# Patient Record
Sex: Female | Born: 1985 | Marital: Single | Smoking: Never smoker
Health system: Southern US, Community
[De-identification: ages and names within clinical notes are randomized; demographics above are authoritative.]

## PROBLEM LIST (undated history)

## (undated) DIAGNOSIS — F329 Major depressive disorder, single episode, unspecified: Secondary | ICD-10-CM

## (undated) DIAGNOSIS — I639 Cerebral infarction, unspecified: Secondary | ICD-10-CM

## (undated) DIAGNOSIS — Z8719 Personal history of other diseases of the digestive system: Secondary | ICD-10-CM

## (undated) DIAGNOSIS — F419 Anxiety disorder, unspecified: Secondary | ICD-10-CM

## (undated) DIAGNOSIS — F32A Depression, unspecified: Secondary | ICD-10-CM

## (undated) DIAGNOSIS — Z87442 Personal history of urinary calculi: Secondary | ICD-10-CM

## (undated) HISTORY — DX: Cerebral infarction, unspecified: I63.9

## (undated) HISTORY — PX: WISDOM TOOTH EXTRACTION: SHX21

---

## 2012-08-28 DIAGNOSIS — G4733 Obstructive sleep apnea (adult) (pediatric): Secondary | ICD-10-CM | POA: Insufficient documentation

## 2012-08-28 DIAGNOSIS — L680 Hirsutism: Secondary | ICD-10-CM | POA: Insufficient documentation

## 2012-08-28 DIAGNOSIS — E785 Hyperlipidemia, unspecified: Secondary | ICD-10-CM | POA: Insufficient documentation

## 2013-05-30 DIAGNOSIS — F32A Depression, unspecified: Secondary | ICD-10-CM | POA: Insufficient documentation

## 2013-05-30 DIAGNOSIS — F329 Major depressive disorder, single episode, unspecified: Secondary | ICD-10-CM | POA: Insufficient documentation

## 2013-05-30 DIAGNOSIS — F330 Major depressive disorder, recurrent, mild: Secondary | ICD-10-CM | POA: Insufficient documentation

## 2013-08-08 DIAGNOSIS — I10 Essential (primary) hypertension: Secondary | ICD-10-CM | POA: Insufficient documentation

## 2014-01-22 ENCOUNTER — Other Ambulatory Visit (INDEPENDENT_AMBULATORY_CARE_PROVIDER_SITE_OTHER): Payer: Self-pay

## 2014-01-22 LAB — COMPREHENSIVE METABOLIC PANEL
ALT: 21 U/L (ref 0–35)
AST: 23 U/L (ref 0–37)
Albumin: 3.7 g/dL (ref 3.5–5.2)
Alkaline Phosphatase: 78 U/L (ref 39–117)
BUN: 13 mg/dL (ref 6–23)
CALCIUM: 8.8 mg/dL (ref 8.4–10.5)
CO2: 24 meq/L (ref 19–32)
CREATININE: 0.51 mg/dL (ref 0.50–1.10)
Chloride: 106 mEq/L (ref 96–112)
Glucose, Bld: 83 mg/dL (ref 70–99)
Potassium: 4.2 mEq/L (ref 3.5–5.3)
Sodium: 139 mEq/L (ref 135–145)
Total Bilirubin: 0.6 mg/dL (ref 0.2–1.2)
Total Protein: 6.2 g/dL (ref 6.0–8.3)

## 2014-01-22 LAB — CBC WITH DIFFERENTIAL/PLATELET
Basophils Absolute: 0 10*3/uL (ref 0.0–0.1)
Basophils Relative: 0 % (ref 0–1)
EOS ABS: 0.1 10*3/uL (ref 0.0–0.7)
Eosinophils Relative: 2 % (ref 0–5)
HCT: 40.9 % (ref 36.0–46.0)
Hemoglobin: 13.7 g/dL (ref 12.0–15.0)
LYMPHS ABS: 1.8 10*3/uL (ref 0.7–4.0)
LYMPHS PCT: 25 % (ref 12–46)
MCH: 32 pg (ref 26.0–34.0)
MCHC: 33.5 g/dL (ref 30.0–36.0)
MCV: 95.6 fL (ref 78.0–100.0)
MONO ABS: 0.7 10*3/uL (ref 0.1–1.0)
MPV: 10.5 fL (ref 9.4–12.4)
Monocytes Relative: 10 % (ref 3–12)
Neutro Abs: 4.5 10*3/uL (ref 1.7–7.7)
Neutrophils Relative %: 63 % (ref 43–77)
Platelets: 337 10*3/uL (ref 150–400)
RBC: 4.28 MIL/uL (ref 3.87–5.11)
RDW: 12.7 % (ref 11.5–15.5)
WBC: 7.1 10*3/uL (ref 4.0–10.5)

## 2014-01-22 LAB — T4: T4 TOTAL: 8.7 ug/dL (ref 4.5–12.0)

## 2014-01-22 LAB — LIPID PANEL
Cholesterol: 172 mg/dL (ref 0–200)
HDL: 55 mg/dL (ref >39)
LDL CALC: 108 mg/dL — AB (ref 0–99)
TRIGLYCERIDES: 44 mg/dL (ref <150)
Total CHOL/HDL Ratio: 3.1 Ratio
VLDL: 9 mg/dL (ref 0–40)

## 2014-01-22 LAB — HEMOGLOBIN A1C
Hgb A1c MFr Bld: 5.1 % (ref <5.7)
MEAN PLASMA GLUCOSE: 100 mg/dL (ref <117)

## 2014-01-22 LAB — TSH: TSH: 4.055 u[IU]/mL (ref 0.350–4.500)

## 2014-01-23 LAB — HCG, SERUM, QUALITATIVE: PREG SERUM: NEGATIVE

## 2014-01-23 LAB — H. PYLORI ANTIBODY, IGG: H Pylori IgG: 0.57 {ISR}

## 2014-01-27 LAB — VITAMIN D 1,25 DIHYDROXY
Vitamin D 1, 25 (OH)2 Total: 46 pg/mL (ref 18–72)
Vitamin D2 1, 25 (OH)2: <8 pg/mL
Vitamin D3 1, 25 (OH)2: 46 pg/mL

## 2014-02-06 ENCOUNTER — Ambulatory Visit (HOSPITAL_COMMUNITY)
Admission: RE | Admit: 2014-02-06 | Discharge: 2014-02-06 | Disposition: A | Discharge status: Home / Self Care | Payer: Commercial Managed Care - PPO | Source: Ambulatory Visit | Attending: General Surgery | Admitting: General Surgery

## 2014-02-06 DIAGNOSIS — Z6841 Body Mass Index (BMI) 40.0 and over, adult: Secondary | ICD-10-CM | POA: Insufficient documentation

## 2014-02-06 DIAGNOSIS — K449 Diaphragmatic hernia without obstruction or gangrene: Secondary | ICD-10-CM | POA: Insufficient documentation

## 2014-02-08 ENCOUNTER — Encounter: Payer: Commercial Managed Care - PPO | Attending: General Surgery | Admitting: Dietician

## 2014-02-08 ENCOUNTER — Encounter: Payer: Self-pay | Admitting: Dietician

## 2014-02-08 DIAGNOSIS — Z6841 Body Mass Index (BMI) 40.0 and over, adult: Secondary | ICD-10-CM | POA: Diagnosis not present

## 2014-02-08 DIAGNOSIS — Z713 Dietary counseling and surveillance: Secondary | ICD-10-CM | POA: Insufficient documentation

## 2014-02-08 NOTE — Progress Notes (Signed)
  Pre-Op Assessment Visit:  Pre-Operative Sleeve Gastrectomy Surgery  Medical Nutrition Therapy:  Appt start time: 1420   End time:  1445.  Patient was seen on 02/08/14 for Pre-Operative Nutrition Assessment. Assessment and letter of approval [facility] coordinator on 02/08/14.   Preferred Learning Style:   No preference indicated   Learning Readiness:   Ready  Handouts given during visit include:  Pre-Op Goals Bariatric Surgery Protein Shakes   During the appointment today the following Pre-Op Goals were reviewed with the patient: Maintain or lose weight as instructed by your surgeon Make healthy food choices Begin to limit portion sizes Limited concentrated sugars and fried foods Keep fat/sugar in the single digits per serving on   food labels Practice CHEWING your food  (aim for 30 chews per bite or until applesauce consistency) Practice not drinking 15 minutes before, during, and 30 minutes after each meal/snack Avoid all carbonated beverages  Avoid/limit caffeinated beverages  Avoid all sugar-sweetened beverages Consume 3 meals per day; eat every 3-5 hours Make a list of non-food related activities Aim for 64-100 ounces of FLUID daily  Aim for at least 60-80 grams of PROTEIN daily Look for a liquid protein source that contain ?15 g protein and ?5 g carbohydrate  (ex: shakes, drinks, shots)  Patient-Centered Goals: Frederik SchmidtRosalie is hoping to be more active and have more energy after surgery.   Kashish would also like to live a longer life after having the surgery.  Frederik SchmidtRosalie feels very confident (10) in being able to follow the Pre-Op goals and feels they are very important (10).   Demonstrated degree of understanding via:  Teach Back  Teaching Method Utilized:  Visual Auditory Hands on  Barriers to learning/adherence to lifestyle change: none  Patient to call the [facility] to enroll in  Pre-Op and Post-Op Nutrition Education when surgery date is scheduled.

## 2014-02-08 NOTE — Patient Instructions (Signed)

## 2014-06-09 ENCOUNTER — Encounter: Payer: 59 | Attending: General Surgery

## 2014-06-09 DIAGNOSIS — Z713 Dietary counseling and surveillance: Secondary | ICD-10-CM | POA: Insufficient documentation

## 2014-06-09 DIAGNOSIS — Z6841 Body Mass Index (BMI) 40.0 and over, adult: Secondary | ICD-10-CM | POA: Insufficient documentation

## 2014-06-09 NOTE — Progress Notes (Signed)
  Pre-Operative Nutrition Class:  Appt start time: 830   End time:  930.  Patient was seen on 06/09/14 for Pre-Operative Bariatric Surgery Education at the [facility].   Surgery date:  Surgery type: Gastric sleeve Start weight at [facility]: 359.5 lbs Weight today: 366.5  TANITA  BODY COMP RESULTS  06/09/14   BMI (kg/m^2) 64.9   Fat Mass (lbs) 225   Fat Free Mass (lbs) 141.5   Total Body Water (lbs) 103.5   Samples given per MNT protocol. Patient educated on appropriate usage: Premier protein shake (qty 1 - vanilla) Lot #: [id number] Exp: 02/2015  Unjury protein powder (unflavored - qty 1) Lot #: [id number] Exp: 04/2015  Bariatric Advantage Calcium citrate chew (orange - qty 1) Lot #: [id number]  Exp: 07/2014  The following the learning objectives were met by the patient during this course:  Identify Pre-Op Dietary Goals and will begin 2 weeks pre-operatively  Identify appropriate sources of fluids and proteins   State protein recommendations and appropriate sources pre and post-operatively  Identify Post-Operative Dietary Goals and will follow for 2 weeks post-operatively  Identify appropriate multivitamin and calcium sources  Describe the need for physical activity post-operatively and will follow MD recommendations  State when to call healthcare provider regarding medication questions or post-operative complications  Handouts given during class include:  Pre-Op Bariatric Surgery Diet Handout  Protein Shake Handout  Post-Op Bariatric Surgery Nutrition Handout  BELT Program Information Flyer  Support Group Information Flyer  [facility] Bariatric Supplements Price List  Follow-Up Plan: Patient will follow-up at [facility] 2 weeks post operatively for diet advancement per MD.

## 2014-07-09 ENCOUNTER — Ambulatory Visit: Payer: Self-pay | Admitting: General Surgery

## 2014-08-19 NOTE — Patient Instructions (Addendum)
20 Dorothy Marquez  08/19/2014   Your procedure is scheduled on:   -08-25-2014 Monday  Enter through [facility]  Entrance and follow signs to [facility]. Arrive at      0900 AM.  (Limit 1 person with you).  Call this number if you have problems the morning of surgery: [phone]  Or Presurgical Testing [phone].   For Living Will and/or Health Care Power Attorney Forms: please provide copy for your medical record,may bring AM of surgery(Forms should be already notarized -we do not provide this service).(08-21-14 Yes/ No information preferred today).  Remember: Follow any bowel prep instructions per MD office. For Cpap use: Bring mask and tubing only.   Do not eat food/ or drink: After Midnight.      Take these medicines the morning of surgery with A SIP OF WATER: Fluoxetine.   Do not wear jewelry, make-up or nail polish.  Do not wear deodorant, lotions, powders, or perfumes.   Do not shave legs and under arms- 48 hours(2 days) prior to first CHG shower.(Shaving face and neck okay.)  Do not bring valuables to the hospital.(Hospital is not responsible for lost valuables).  Contacts, dentures or removable bridgework, body piercing, hair pins may not be worn into surgery.  Leave suitcase in the car. After surgery it may be brought to your room.  For patients admitted to the hospital, checkout time is 11:00 AM the day of discharge.(Restricted visitors-Any Persons displaying flu-like symptoms or illness).    Patients discharged the day of surgery will not be allowed to drive home. Must have responsible person with you x 24 hours once discharged.  Name and phone number of your driver: Liane Comber, boyfriend- [phone] cell     Please read over the following fact sheets that you were given:  CHG(Chlorhexidine Gluconate 4% Surgical Soap) use.           [facility] - Preparing for Surgery Before surgery, you can play an important role.  Because skin is not sterile,  your skin needs to be as free of germs as possible.  You can reduce the number of germs on your skin by washing with CHG (chlorahexidine gluconate) soap before surgery.  CHG is an antiseptic cleaner which kills germs and bonds with the skin to continue killing germs even after washing. Please DO NOT use if you have an allergy to CHG or antibacterial soaps.  If your skin becomes reddened/irritated stop using the CHG and inform your nurse when you arrive at Short Stay. Do not shave (including legs and underarms) for at least 48 hours prior to the first CHG shower.  You may shave your face/neck. Please follow these instructions carefully:  1.  Shower with CHG Soap the night before surgery and the  morning of Surgery.  2.  If you choose to wash your hair, wash your hair first as usual with your  normal  shampoo.  3.  After you shampoo, rinse your hair and body thoroughly to remove the  shampoo.                           4.  Use CHG as you would any other liquid soap.  You can apply chg directly  to the skin and wash                       Gently with a scrungie or clean washcloth.  5.  Apply the CHG Soap to your body ONLY FROM THE NECK DOWN.   Do not use on face/ open                           Wound or open sores. Avoid contact with eyes, ears mouth and genitals (private parts).                       Wash face,  Genitals (private parts) with your normal soap.             6.  Wash thoroughly, paying special attention to the area where your surgery  will be performed.  7.  Thoroughly rinse your body with warm water from the neck down.  8.  DO NOT shower/wash with your normal soap after using and rinsing off  the CHG Soap.                9.  Pat yourself dry with a clean towel.            10.  Wear clean pajamas.            11.  Place clean sheets on your bed the night of your first shower and do not  sleep with pets. Day of Surgery : Do not apply any lotions/deodorants the morning of surgery.  Please wear  clean clothes to the hospital/surgery center.  FAILURE TO FOLLOW THESE INSTRUCTIONS MAY RESULT IN THE CANCELLATION OF YOUR SURGERY PATIENT SIGNATURE_________________________________  NURSE SIGNATURE__________________________________  ________________________________________________________________________

## 2014-08-21 ENCOUNTER — Encounter (HOSPITAL_COMMUNITY)
Admission: RE | Admit: 2014-08-21 | Discharge: 2014-08-21 | Disposition: A | Discharge status: Home / Self Care | Payer: 59 | Source: Ambulatory Visit | Attending: General Surgery | Admitting: General Surgery

## 2014-08-21 ENCOUNTER — Encounter (HOSPITAL_COMMUNITY): Payer: Self-pay

## 2014-08-21 DIAGNOSIS — Z01818 Encounter for other preprocedural examination: Secondary | ICD-10-CM | POA: Insufficient documentation

## 2014-08-21 HISTORY — DX: Personal history of urinary calculi: Z87.442

## 2014-08-21 HISTORY — DX: Depression, unspecified: F32.A

## 2014-08-21 HISTORY — DX: Major depressive disorder, single episode, unspecified: F32.9

## 2014-08-21 HISTORY — DX: Anxiety disorder, unspecified: F41.9

## 2014-08-21 HISTORY — DX: Personal history of other diseases of the digestive system: Z87.19

## 2014-08-21 LAB — CBC WITH DIFFERENTIAL/PLATELET
Basophils Absolute: 0 10*3/uL (ref 0.0–0.1)
Basophils Relative: 0 % (ref 0–1)
EOS ABS: 0.1 10*3/uL (ref 0.0–0.7)
EOS PCT: 2 % (ref 0–5)
HEMATOCRIT: 42 % (ref 36.0–46.0)
HEMOGLOBIN: 13.9 g/dL (ref 12.0–15.0)
LYMPHS ABS: 1.9 10*3/uL (ref 0.7–4.0)
Lymphocytes Relative: 29 % (ref 12–46)
MCH: 31.7 pg (ref 26.0–34.0)
MCHC: 33.1 g/dL (ref 30.0–36.0)
MCV: 95.7 fL (ref 78.0–100.0)
Monocytes Absolute: 0.8 10*3/uL (ref 0.1–1.0)
Monocytes Relative: 12 % (ref 3–12)
Neutro Abs: 3.9 10*3/uL (ref 1.7–7.7)
Neutrophils Relative %: 57 % (ref 43–77)
Platelets: 303 10*3/uL (ref 150–400)
RBC: 4.39 MIL/uL (ref 3.87–5.11)
RDW: 12.7 % (ref 11.5–15.5)
WBC: 6.8 10*3/uL (ref 4.0–10.5)

## 2014-08-21 LAB — COMPREHENSIVE METABOLIC PANEL
ALT: 20 U/L (ref 14–54)
AST: 20 U/L (ref 15–41)
Albumin: 4 g/dL (ref 3.5–5.0)
Alkaline Phosphatase: 81 U/L (ref 38–126)
Anion gap: 5 (ref 5–15)
BUN: 10 mg/dL (ref 6–20)
CALCIUM: 9.4 mg/dL (ref 8.9–10.3)
CO2: 29 mmol/L (ref 22–32)
Chloride: 106 mmol/L (ref 101–111)
Creatinine, Ser: 0.68 mg/dL (ref 0.44–1.00)
GFR calc Af Amer: >60 mL/min (ref >60)
GLUCOSE: 92 mg/dL (ref 65–99)
Potassium: 4.6 mmol/L (ref 3.5–5.1)
SODIUM: 140 mmol/L (ref 135–145)
Total Bilirubin: 0.6 mg/dL (ref 0.3–1.2)
Total Protein: 7.2 g/dL (ref 6.5–8.1)

## 2014-08-21 NOTE — Pre-Procedure Instructions (Addendum)
08-21-14 EKG 12'15 Epic. 08-21-14 1100 Bari-bed  requested with portable equipment"Gail".

## 2014-08-25 ENCOUNTER — Encounter (HOSPITAL_COMMUNITY)
Admission: RE | Disposition: A | Discharge status: Home / Self Care | Payer: Self-pay | Source: Ambulatory Visit | Attending: General Surgery

## 2014-08-25 ENCOUNTER — Inpatient Hospital Stay (HOSPITAL_COMMUNITY)
Admission: RE | Admit: 2014-08-25 | Discharge: 2014-08-28 | DRG: 621 | Disposition: A | Discharge status: Home / Self Care | Payer: 59 | Source: Ambulatory Visit | Attending: General Surgery | Admitting: General Surgery

## 2014-08-25 ENCOUNTER — Inpatient Hospital Stay (HOSPITAL_COMMUNITY): Payer: 59 | Admitting: Certified Registered Nurse Anesthetist

## 2014-08-25 ENCOUNTER — Encounter (HOSPITAL_COMMUNITY): Payer: Self-pay

## 2014-08-25 DIAGNOSIS — R110 Nausea: Secondary | ICD-10-CM

## 2014-08-25 DIAGNOSIS — F419 Anxiety disorder, unspecified: Secondary | ICD-10-CM | POA: Diagnosis present

## 2014-08-25 DIAGNOSIS — Z6841 Body Mass Index (BMI) 40.0 and over, adult: Secondary | ICD-10-CM | POA: Diagnosis not present

## 2014-08-25 DIAGNOSIS — F329 Major depressive disorder, single episode, unspecified: Secondary | ICD-10-CM | POA: Diagnosis present

## 2014-08-25 DIAGNOSIS — K219 Gastro-esophageal reflux disease without esophagitis: Secondary | ICD-10-CM | POA: Diagnosis present

## 2014-08-25 DIAGNOSIS — Z833 Family history of diabetes mellitus: Secondary | ICD-10-CM

## 2014-08-25 DIAGNOSIS — Z01812 Encounter for preprocedural laboratory examination: Secondary | ICD-10-CM

## 2014-08-25 DIAGNOSIS — E785 Hyperlipidemia, unspecified: Secondary | ICD-10-CM | POA: Diagnosis present

## 2014-08-25 DIAGNOSIS — IMO0001 Reserved for inherently not codable concepts without codable children: Secondary | ICD-10-CM | POA: Diagnosis present

## 2014-08-25 DIAGNOSIS — K449 Diaphragmatic hernia without obstruction or gangrene: Secondary | ICD-10-CM | POA: Diagnosis present

## 2014-08-25 DIAGNOSIS — R030 Elevated blood-pressure reading, without diagnosis of hypertension: Secondary | ICD-10-CM | POA: Diagnosis present

## 2014-08-25 DIAGNOSIS — Z8249 Family history of ischemic heart disease and other diseases of the circulatory system: Secondary | ICD-10-CM | POA: Diagnosis not present

## 2014-08-25 DIAGNOSIS — Z9884 Bariatric surgery status: Secondary | ICD-10-CM

## 2014-08-25 DIAGNOSIS — G4733 Obstructive sleep apnea (adult) (pediatric): Secondary | ICD-10-CM | POA: Diagnosis present

## 2014-08-25 HISTORY — PX: LAPAROSCOPIC GASTRIC SLEEVE RESECTION WITH HIATAL HERNIA REPAIR: SHX6512

## 2014-08-25 HISTORY — PX: UPPER GI ENDOSCOPY: SHX6162

## 2014-08-25 LAB — PREGNANCY, URINE: PREG TEST UR: NEGATIVE

## 2014-08-25 LAB — HEMOGLOBIN AND HEMATOCRIT, BLOOD
HEMATOCRIT: 43.2 % (ref 36.0–46.0)
Hemoglobin: 14.2 g/dL (ref 12.0–15.0)

## 2014-08-25 SURGERY — GASTRECTOMY, SLEEVE, LAPAROSCOPIC, WITH HIATAL HERNIA REPAIR
Anesthesia: General | Site: Esophagus

## 2014-08-25 MED ORDER — CHLORHEXIDINE GLUCONATE 4 % EX LIQD: 60.0000 mL | Freq: Once | CUTANEOUS | Status: DC

## 2014-08-25 MED ORDER — LACTATED RINGERS IR SOLN
Status: DC | PRN
  Administered 2014-08-25: 1000 mL

## 2014-08-25 MED ORDER — CEFOTETAN DISODIUM-DEXTROSE 2-2.08 GM-% IV SOLR
2.0000 g | INTRAVENOUS | Status: AC
  Administered 2014-08-25: 2 g via INTRAVENOUS

## 2014-08-25 MED ORDER — PROMETHAZINE HCL 25 MG/ML IJ SOLN
INTRAMUSCULAR | Status: AC
  Filled 2014-08-25: qty 1

## 2014-08-25 MED ORDER — UNJURY VANILLA POWDER
2.0000 [oz_av] | Freq: Four times a day (QID) | ORAL | Status: DC
  Administered 2014-08-27 (×2): 2 [oz_av] via ORAL

## 2014-08-25 MED ORDER — METOCLOPRAMIDE HCL 5 MG/ML IJ SOLN
INTRAMUSCULAR | Status: AC
  Filled 2014-08-25: qty 2

## 2014-08-25 MED ORDER — NEOSTIGMINE METHYLSULFATE 10 MG/10ML IV SOLN
INTRAVENOUS | Status: DC | PRN
  Administered 2014-08-25: 3 mg via INTRAVENOUS

## 2014-08-25 MED ORDER — PROPOFOL 10 MG/ML IV BOLUS
INTRAVENOUS | Status: DC | PRN
  Administered 2014-08-25: 200 mg via INTRAVENOUS

## 2014-08-25 MED ORDER — FENTANYL CITRATE (PF) 100 MCG/2ML IJ SOLN
INTRAMUSCULAR | Status: DC | PRN
  Administered 2014-08-25: 25 ug via INTRAVENOUS
  Administered 2014-08-25 (×3): 50 ug via INTRAVENOUS
  Administered 2014-08-25: 25 ug via INTRAVENOUS
  Administered 2014-08-25 (×3): 50 ug via INTRAVENOUS

## 2014-08-25 MED ORDER — CISATRACURIUM BESYLATE 20 MG/10ML IV SOLN
INTRAVENOUS | Status: AC
  Filled 2014-08-25: qty 10

## 2014-08-25 MED ORDER — FENTANYL CITRATE (PF) 100 MCG/2ML IJ SOLN
INTRAMUSCULAR | Status: AC
  Filled 2014-08-25: qty 2

## 2014-08-25 MED ORDER — ENOXAPARIN SODIUM 30 MG/0.3ML ~~LOC~~ SOLN
30.0000 mg | Freq: Two times a day (BID) | SUBCUTANEOUS | Status: DC
  Administered 2014-08-26 – 2014-08-28 (×5): 30 mg via SUBCUTANEOUS
  Filled 2014-08-25 (×7): qty 0.3

## 2014-08-25 MED ORDER — LIDOCAINE HCL (CARDIAC) 20 MG/ML IV SOLN
INTRAVENOUS | Status: AC
  Filled 2014-08-25: qty 5

## 2014-08-25 MED ORDER — CISATRACURIUM BESYLATE (PF) 10 MG/5ML IV SOLN
INTRAVENOUS | Status: DC | PRN
  Administered 2014-08-25: 4 mg via INTRAVENOUS
  Administered 2014-08-25 (×2): 2 mg via INTRAVENOUS
  Administered 2014-08-25: 10 mg via INTRAVENOUS

## 2014-08-25 MED ORDER — DIPHENHYDRAMINE HCL 50 MG/ML IJ SOLN
INTRAMUSCULAR | Status: DC | PRN
  Administered 2014-08-25 (×2): 12.5 mg via INTRAVENOUS

## 2014-08-25 MED ORDER — HEPARIN SODIUM (PORCINE) 5000 UNIT/ML IJ SOLN
5000.0000 [IU] | INTRAMUSCULAR | Status: AC
  Administered 2014-08-25: 5000 [IU] via SUBCUTANEOUS
  Filled 2014-08-25: qty 1

## 2014-08-25 MED ORDER — PANTOPRAZOLE SODIUM 40 MG IV SOLR
40.0000 mg | Freq: Every day | INTRAVENOUS | Status: DC
  Administered 2014-08-25 – 2014-08-27 (×3): 40 mg via INTRAVENOUS
  Filled 2014-08-25 (×4): qty 40

## 2014-08-25 MED ORDER — FENTANYL CITRATE (PF) 250 MCG/5ML IJ SOLN
INTRAMUSCULAR | Status: AC
  Filled 2014-08-25: qty 5

## 2014-08-25 MED ORDER — KCL IN DEXTROSE-NACL 20-5-0.45 MEQ/L-%-% IV SOLN
INTRAVENOUS | Status: DC
  Administered 2014-08-25: 125 mL/h via INTRAVENOUS
  Administered 2014-08-26 – 2014-08-27 (×3): via INTRAVENOUS
  Administered 2014-08-27: 1000 mL via INTRAVENOUS
  Administered 2014-08-27: via INTRAVENOUS
  Administered 2014-08-28: 1000 mL via INTRAVENOUS
  Filled 2014-08-25 (×12): qty 1000

## 2014-08-25 MED ORDER — METHOCARBAMOL 1000 MG/10ML IJ SOLN
1000.0000 mg | Freq: Three times a day (TID) | INTRAVENOUS | Status: DC
  Administered 2014-08-25 – 2014-08-28 (×9): 1000 mg via INTRAVENOUS
  Filled 2014-08-25 (×10): qty 10

## 2014-08-25 MED ORDER — MEPERIDINE HCL 50 MG/ML IJ SOLN: 6.2500 mg | INTRAMUSCULAR | Status: DC | PRN

## 2014-08-25 MED ORDER — UNJURY CHICKEN SOUP POWDER
2.0000 [oz_av] | Freq: Four times a day (QID) | ORAL | Status: DC
  Administered 2014-08-28 (×2): 2 [oz_av] via ORAL

## 2014-08-25 MED ORDER — SODIUM CHLORIDE 0.9 % IJ SOLN
INTRAMUSCULAR | Status: AC
  Filled 2014-08-25: qty 50

## 2014-08-25 MED ORDER — MORPHINE SULFATE 2 MG/ML IJ SOLN
2.0000 mg | INTRAMUSCULAR | Status: DC | PRN
  Administered 2014-08-25: 2 mg via INTRAVENOUS
  Administered 2014-08-26: 4 mg via INTRAVENOUS
  Administered 2014-08-26 (×2): 2 mg via INTRAVENOUS
  Administered 2014-08-26: 4 mg via INTRAVENOUS
  Filled 2014-08-25: qty 1
  Filled 2014-08-25 (×2): qty 2
  Filled 2014-08-25 (×2): qty 1

## 2014-08-25 MED ORDER — BUPIVACAINE LIPOSOME 1.3 % IJ SUSP
20.0000 mL | Freq: Once | INTRAMUSCULAR | Status: AC
  Administered 2014-08-25: 20 mL
  Filled 2014-08-25: qty 20

## 2014-08-25 MED ORDER — GLYCOPYRROLATE 0.2 MG/ML IJ SOLN
INTRAMUSCULAR | Status: AC
  Filled 2014-08-25: qty 3

## 2014-08-25 MED ORDER — METOCLOPRAMIDE HCL 5 MG/ML IJ SOLN
INTRAMUSCULAR | Status: DC | PRN
  Administered 2014-08-25 (×2): 5 mg via INTRAVENOUS

## 2014-08-25 MED ORDER — PROMETHAZINE HCL 25 MG/ML IJ SOLN: 12.5000 mg | Freq: Four times a day (QID) | INTRAMUSCULAR | Status: DC | PRN

## 2014-08-25 MED ORDER — DEXAMETHASONE SODIUM PHOSPHATE 10 MG/ML IJ SOLN
INTRAMUSCULAR | Status: AC
  Filled 2014-08-25: qty 1

## 2014-08-25 MED ORDER — LACTATED RINGERS IV SOLN
INTRAVENOUS | Status: DC
  Administered 2014-08-25: 1000 mL via INTRAVENOUS
  Administered 2014-08-25: 13:00:00 via INTRAVENOUS

## 2014-08-25 MED ORDER — UNJURY CHOCOLATE CLASSIC POWDER
2.0000 [oz_av] | Freq: Four times a day (QID) | ORAL | Status: DC
  Administered 2014-08-27: 2 [oz_av] via ORAL

## 2014-08-25 MED ORDER — ONDANSETRON HCL 4 MG/2ML IJ SOLN
4.0000 mg | INTRAMUSCULAR | Status: DC | PRN
  Administered 2014-08-25 – 2014-08-27 (×5): 4 mg via INTRAVENOUS
  Filled 2014-08-25 (×5): qty 2

## 2014-08-25 MED ORDER — ACETAMINOPHEN 160 MG/5ML PO SOLN: 325.0000 mg | ORAL | Status: DC | PRN

## 2014-08-25 MED ORDER — MIDAZOLAM HCL 2 MG/2ML IJ SOLN
INTRAMUSCULAR | Status: AC
  Filled 2014-08-25: qty 2

## 2014-08-25 MED ORDER — ONDANSETRON HCL 4 MG/2ML IJ SOLN
INTRAMUSCULAR | Status: DC | PRN
  Administered 2014-08-25 (×2): 4 mg via INTRAVENOUS

## 2014-08-25 MED ORDER — FENTANYL CITRATE (PF) 100 MCG/2ML IJ SOLN
25.0000 ug | INTRAMUSCULAR | Status: DC | PRN
  Administered 2014-08-25 (×2): 50 ug via INTRAVENOUS

## 2014-08-25 MED ORDER — PROPOFOL 10 MG/ML IV BOLUS
INTRAVENOUS | Status: AC
  Filled 2014-08-25: qty 20

## 2014-08-25 MED ORDER — PROMETHAZINE HCL 25 MG/ML IJ SOLN
6.2500 mg | INTRAMUSCULAR | Status: AC | PRN
  Administered 2014-08-25 (×2): 12.5 mg via INTRAVENOUS

## 2014-08-25 MED ORDER — 0.9 % SODIUM CHLORIDE (POUR BTL) OPTIME
TOPICAL | Status: DC | PRN
  Administered 2014-08-25: 1000 mL

## 2014-08-25 MED ORDER — DEXAMETHASONE SODIUM PHOSPHATE 4 MG/ML IJ SOLN
INTRAMUSCULAR | Status: DC | PRN
  Administered 2014-08-25: 10 mg via INTRAVENOUS

## 2014-08-25 MED ORDER — OXYCODONE HCL 5 MG/5ML PO SOLN
5.0000 mg | ORAL | Status: DC | PRN
  Administered 2014-08-26 – 2014-08-27 (×2): 10 mg via ORAL
  Administered 2014-08-27 – 2014-08-28 (×2): 5 mg via ORAL
  Filled 2014-08-25: qty 10
  Filled 2014-08-25 (×2): qty 5
  Filled 2014-08-25: qty 10

## 2014-08-25 MED ORDER — GLYCOPYRROLATE 0.2 MG/ML IJ SOLN
INTRAMUSCULAR | Status: AC
  Filled 2014-08-25: qty 1

## 2014-08-25 MED ORDER — LIDOCAINE HCL (CARDIAC) 20 MG/ML IV SOLN
INTRAVENOUS | Status: DC | PRN
  Administered 2014-08-25: 50 mg via INTRAVENOUS

## 2014-08-25 MED ORDER — NEOSTIGMINE METHYLSULFATE 10 MG/10ML IV SOLN
INTRAVENOUS | Status: AC
  Filled 2014-08-25: qty 1

## 2014-08-25 MED ORDER — SODIUM CHLORIDE 0.9 % IJ SOLN
INTRAMUSCULAR | Status: DC | PRN
  Administered 2014-08-25: 50 mL

## 2014-08-25 MED ORDER — SUCCINYLCHOLINE CHLORIDE 20 MG/ML IJ SOLN
INTRAMUSCULAR | Status: DC | PRN
  Administered 2014-08-25: 100 mg via INTRAVENOUS

## 2014-08-25 MED ORDER — MIDAZOLAM HCL 5 MG/5ML IJ SOLN
INTRAMUSCULAR | Status: DC | PRN
  Administered 2014-08-25: 2 mg via INTRAVENOUS

## 2014-08-25 MED ORDER — GLYCOPYRROLATE 0.2 MG/ML IJ SOLN
INTRAMUSCULAR | Status: DC | PRN
  Administered 2014-08-25: 0.2 mg via INTRAVENOUS
  Administered 2014-08-25: 0.3 mg via INTRAVENOUS

## 2014-08-25 MED ORDER — HYDROMORPHONE HCL 2 MG/ML IJ SOLN
INTRAMUSCULAR | Status: AC
  Filled 2014-08-25: qty 1

## 2014-08-25 MED ORDER — DIPHENHYDRAMINE HCL 50 MG/ML IJ SOLN
INTRAMUSCULAR | Status: AC
  Filled 2014-08-25: qty 1

## 2014-08-25 MED ORDER — HYDROMORPHONE HCL 1 MG/ML IJ SOLN
INTRAMUSCULAR | Status: DC | PRN
  Administered 2014-08-25 (×2): 1 mg via INTRAVENOUS

## 2014-08-25 MED ORDER — ACETAMINOPHEN 160 MG/5ML PO SOLN: 650.0000 mg | ORAL | Status: DC | PRN

## 2014-08-25 MED ORDER — ONDANSETRON HCL 4 MG/2ML IJ SOLN
INTRAMUSCULAR | Status: AC
  Filled 2014-08-25: qty 2

## 2014-08-25 MED ORDER — CEFOTETAN DISODIUM-DEXTROSE 2-2.08 GM-% IV SOLR
INTRAVENOUS | Status: AC
  Filled 2014-08-25: qty 50

## 2014-08-25 MED ORDER — LACTATED RINGERS IV SOLN: INTRAVENOUS | Status: DC

## 2014-08-25 SURGICAL SUPPLY — 60 items
APPLICATOR COTTON TIP 6IN STRL (MISCELLANEOUS) IMPLANT
APPLIER CLIP ROT 10 11.4 M/L (STAPLE)
BLADE SURG SZ11 CARB STEEL (BLADE) ×4 IMPLANT
CABLE HIGH FREQUENCY MONO STRZ (ELECTRODE) IMPLANT
CHLORAPREP W/TINT 26ML (MISCELLANEOUS) ×8 IMPLANT
CLIP APPLIE ROT 10 11.4 M/L (STAPLE) IMPLANT
DERMABOND ADVANCED (GAUZE/BANDAGES/DRESSINGS) ×2
DERMABOND ADVANCED .7 DNX12 (GAUZE/BANDAGES/DRESSINGS) ×2 IMPLANT
DEVICE SUT QUICK LOAD TK 5 (STAPLE) IMPLANT
DEVICE SUT TI-KNOT TK 5X26 (MISCELLANEOUS) IMPLANT
DEVICE SUTURE ENDOST 10MM (ENDOMECHANICALS) IMPLANT
DEVICE TI KNOT TK5 (MISCELLANEOUS)
DEVICE TROCAR PUNCTURE CLOSURE (ENDOMECHANICALS) ×4 IMPLANT
DRAPE CAMERA CLOSED 9X96 (DRAPES) ×4 IMPLANT
DRAPE UTILITY XL STRL (DRAPES) ×8 IMPLANT
ELECT REM PT RETURN 9FT ADLT (ELECTROSURGICAL) ×4
ELECTRODE REM PT RTRN 9FT ADLT (ELECTROSURGICAL) ×2 IMPLANT
GAUZE SPONGE 4X4 12PLY STRL (GAUZE/BANDAGES/DRESSINGS) IMPLANT
GLOVE BIOGEL M STRL SZ7.5 (GLOVE) ×4 IMPLANT
GOWN STRL REUS W/TWL XL LVL3 (GOWN DISPOSABLE) ×16 IMPLANT
HOVERMATT SINGLE USE (MISCELLANEOUS) ×4 IMPLANT
KIT BASIN OR (CUSTOM PROCEDURE TRAY) ×4 IMPLANT
LIQUID BAND (GAUZE/BANDAGES/DRESSINGS) ×4 IMPLANT
NEEDLE SPNL 22GX3.5 QUINCKE BK (NEEDLE) ×4 IMPLANT
PACK UNIVERSAL I (CUSTOM PROCEDURE TRAY) ×4 IMPLANT
PEN SKIN MARKING BROAD (MISCELLANEOUS) ×4 IMPLANT
QUICK LOAD TK 5 (STAPLE)
RELOAD STAPLER BLUE 60MM (STAPLE) ×4 IMPLANT
RELOAD STAPLER GOLD 60MM (STAPLE) IMPLANT
RELOAD STAPLER GREEN 60MM (STAPLE) ×6 IMPLANT
SCISSORS LAP 5X35 DISP (ENDOMECHANICALS) IMPLANT
SCISSORS LAP 5X45 EPIX DISP (ENDOMECHANICALS) ×4 IMPLANT
SEALANT SURGICAL APPL DUAL CAN (MISCELLANEOUS) IMPLANT
SET IRRIG TUBING LAPAROSCOPIC (IRRIGATION / IRRIGATOR) ×4 IMPLANT
SHEARS CURVED HARMONIC AC 45CM (MISCELLANEOUS) ×4 IMPLANT
SLEEVE ADV FIXATION 5X100MM (TROCAR) ×8 IMPLANT
SLEEVE GASTRECTOMY 36FR VISIGI (MISCELLANEOUS) ×4 IMPLANT
SLEEVE XCEL OPT CAN 5 100 (ENDOMECHANICALS) IMPLANT
SOLUTION ANTI FOG 6CC (MISCELLANEOUS) ×4 IMPLANT
SPONGE LAP 18X18 X RAY DECT (DISPOSABLE) ×4 IMPLANT
STAPLER ECHELON BIOABSB 60 FLE (MISCELLANEOUS) ×24 IMPLANT
STAPLER ECHELON LONG 60 440 (INSTRUMENTS) ×4 IMPLANT
STAPLER RELOAD BLUE 60MM (STAPLE) ×8
STAPLER RELOAD GOLD 60MM (STAPLE)
STAPLER RELOAD GREEN 60MM (STAPLE) ×12
SUT MNCRL AB 4-0 PS2 18 (SUTURE) ×8 IMPLANT
SUT SURGIDAC NAB ES-9 0 48 120 (SUTURE) IMPLANT
SUT VICRYL 0 TIES 12 18 (SUTURE) ×4 IMPLANT
SYR 20CC LL (SYRINGE) ×4 IMPLANT
SYR 50ML LL SCALE MARK (SYRINGE) ×4 IMPLANT
TOWEL OR 17X26 10 PK STRL BLUE (TOWEL DISPOSABLE) ×4 IMPLANT
TOWEL OR NON WOVEN STRL DISP B (DISPOSABLE) ×4 IMPLANT
TRAY FOLEY W/METER SILVER 14FR (SET/KITS/TRAYS/PACK) IMPLANT
TROCAR ADV FIXATION 5X100MM (TROCAR) ×4 IMPLANT
TROCAR BLADELESS 15MM (ENDOMECHANICALS) IMPLANT
TROCAR BLADELESS OPT 5 100 (ENDOMECHANICALS) ×4 IMPLANT
TUBING CONNECTING 10 (TUBING) ×3 IMPLANT
TUBING CONNECTING 10' (TUBING) ×1
TUBING ENDO SMARTCAP (MISCELLANEOUS) ×4 IMPLANT
TUBING FILTER THERMOFLATOR (ELECTROSURGICAL) ×4 IMPLANT

## 2014-08-25 NOTE — Op Note (Signed)
08/25/2014 Dorothy Marquez [birth year] [id number]   PRE-OPERATIVE DIAGNOSIS:  Morbid obesity BMI 62 Dyslipidemia Depression Elevated blood pressure  POST-OPERATIVE DIAGNOSIS:  same  PROCEDURE:  Procedure(s): LAPAROSCOPIC SLEEVE GASTRECTOMY  UPPER GI ENDOSCOPY  SURGEON:  Surgeon(s): [name], MD FACS FASMBS  ASSISTANTS: Luretha Murphy, MD FACS  ANESTHESIA:   general  DRAINS: none   BOUGIE: 36 fr ViSiGi  LOCAL MEDICATIONS USED:  MARCAINE + Exparel  SPECIMEN:  Source of Specimen:  Greater curvature of stomach  DISPOSITION OF SPECIMEN:  PATHOLOGY  COUNTS:  YES  INDICATION FOR PROCEDURE: This is a very pleasant 29 year old morbidly obese WF who has had unsuccessful attempts for sustained weight loss. She presents today for a planned laparoscopic sleeve gastrectomy with upper endoscopy. We have discussed the risk and benefits of the procedure extensively preoperatively. Please see my separate notes.  PROCEDURE: After obtaining informed consent and receiving 5000 units of subcutaneous heparin, the patient was brought to the operating room at [facility] and placed supine on the operating room table. General endotracheal anesthesia was established. Sequential compression devices were placed. A Foley catheter was placed. A orogastric tube was placed. The patient's abdomen was prepped and draped in the usual standard surgical fashion. She received preoperative IV antibiotics. A surgical timeout was performed.  Access to the abdomen was achieved using a 5 mm 0 laparoscope thru a 5 mm trocar In the left upper Quadrant 2 fingerbreadths below the left subcostal margin using the Optiview technique. Pneumoperitoneum was smoothly established up to 15 mm of mercury. The laparoscope was advanced and the abdominal cavity was surveilled. There was evidence of a hiatal hernia on laparoscopy - gap in the left and right crus anteriorly.  A 5 mm trocar was placed slightly above and to the left  of the umbilicus under direct visualization. The patient was then placed in reverse Trendelenburg. The [facility] liver retractor was placed under the left lobe of the liver through a 5 mm trocar incision site in the subxiphoid position. A 5 mm trocar was placed in the lateral right upper quadrant along with a 15 mm trocar in the mid right abdomen  All under direct visualization after local had been infiltrated.  The stomach was inspected. It was completely decompressed and the orogastric tube was removed.  There was no anterior dimple that was obviously visible. The calibration tube was placed in the oropharynx and guided down into the stomach by the CRNA. 10 mL of air was insufflated into the calibration balloon. The calibration tubing was then gently pulled back by the CRNA and it did not slide past the GE junction - suggesting no clinical evidence of a significant hiatal hernia. At this point the calibration tubing was desufflated and pulled back into the esophagus.    We identified the pylorus and measured 5- 6 cm proximal to the pylorus and identified an area of where we would start taking down the short gastric vessels. Harmonic scalpel was used to take down the short gastric vessels along the greater curvature of the stomach. We were able to enter the lesser sac. We continued to march along the greater curvature of the stomach taking down the short gastrics. As we approached the gastrosplenic ligament we took care in this area not to injure the spleen. We were able to take down the entire gastrosplenic ligament. We then mobilized the fundus away from the left crus of diaphragm. There were not any significant posterior gastric avascular attachments. This left the stomach completely  mobilized. No vessels had been taken down along the lesser curvature of the stomach.  I retested for a hiatal hernia. The calibration tube was advanced down into the stomach by the CRNA. 10 mL of air was insufflated into  the calibration balloon. The calibration tubing was then gently pulled back by the CRNA and it did not slide past the GE junction - suggesting no clinical evidence of a significant hiatal hernia. At this point the calibration tubing was desufflated and removed.    We then reidentified the pylorus. A 36Fr ViSiGi was then placed in the oropharynx and advanced down into the stomach and placed in the distal antrum and positioned along the lesser curvature. It was placed under suction which secured the 36Fr ViSiGi in place along the lesser curve. Then using the Ethicon echelon 60 mm stapler with a green load with Seamguard, I placed a stapler along the antrum approximately 5 cm from the pylorus. The stapler was angled so that there is ample room at the angularis incisura. I then fired the first staple load after inspecting it posteriorly to ensure adequate space both anteriorly and posteriorly. At this point I still was not completely past the angularis so with another green load with Seamguard, I placed the stapler in position just inside the prior stapleline. We then rotated the stomach to insure that there was adequate anteriorly as well as posteriorly. The stapler was then fired. I used another 60mm green cartridge with seamguard. At this point I started using 60 mm blue load staple cartridges with Seamguard. The echelon stapler was then repositioned with a 60 mm blue load with Seamguard and we continued to march up along the ViSiGi. My assistant was holding traction along the greater curvature stomach along the cauterized short gastric vessels ensuring that the stomach was symmetrically retracted. Prior to each firing of the staple, we rotated the stomach to ensure that there is adequate stomach left.  As we approached the fundus, I used 60 mm blue cartridge with Seamguard aiming slightly lateral to the esophageal fat pad.  The sleeve was inspected. There is no evidence of cork screw. The staple line appeared  hemostatic. The CRNA inflated the ViSiGi to the green zone and the upper abdomen was flooded with saline. There were no bubbles. The sleeve was decompressed and the ViSiGi removed. My assistant scrubbed out and performed an upper endoscopy. The sleeve easily distended with air and the scope was easily advanced to the pylorus. There is no evidence of internal bleeding or cork screwing. There was no narrowing at the angularis. There is no evidence of bubbles. Please see his operative note for further details. The gastric sleeve was decompressed and the endoscope was removed.  The greater curvature the stomach was grasped with a laparoscopic grasper and removed from the 15 mm trocar site.  The liver retractor was removed. I then closed the 15 mm trocar site with 2 interrupted 0 Vicryl sutures through the fascia using the endoclose. The closure was viewed laparoscopically and it was airtight. 70 cc of Exparel was then infiltrated in the preperitoneal spaces around the trocar sites. Pneumoperitoneum was released. All trocar sites were closed with a 4-0 Monocryl in a subcuticular fashion followed by the application of Dermabond. The patient was extubated and taken to the recovery room in stable condition. All needle, instrument, and sponge counts were correct x2. There are no immediate complications  (3) 60 mm green with Seamguard (2) 60 mm blue with seamguard  PLAN  OF CARE: Admit to inpatient   PATIENT DISPOSITION:  PACU - hemodynamically stable.   Delay start of Pharmacological VTE agent (>24hrs) due to surgical blood loss or risk of bleeding:  no  Dorothy Marquez. Andrey Campanile, MD, FACS General, Bariatric, & Minimally Invasive Surgery [facility], [place]

## 2014-08-25 NOTE — Anesthesia Preprocedure Evaluation (Addendum)
Anesthesia Evaluation  Patient identified by MRN, date of birth, ID band Patient awake    Reviewed: Allergy & Precautions, NPO status , Patient's Chart, lab work & pertinent test results  Airway Mallampati: II  TM Distance: >3 FB Neck ROM: Full    Dental no notable dental hx.    Pulmonary neg pulmonary ROS,  breath sounds clear to auscultation  Pulmonary exam normal       Cardiovascular negative cardio ROS Normal cardiovascular examRhythm:Regular Rate:Normal     Neuro/Psych negative neurological ROS  negative psych ROS   GI/Hepatic Neg liver ROS, hiatal hernia,   Endo/Other  Morbid obesity  Renal/GU negative Renal ROS  negative genitourinary   Musculoskeletal negative musculoskeletal ROS (+)   Abdominal (+) + obese,   Peds negative pediatric ROS (+)  Hematology negative hematology ROS (+)   Anesthesia Other Findings   Reproductive/Obstetrics negative OB ROS                             Anesthesia Physical Anesthesia Plan  ASA: III  Anesthesia Plan: General   Post-op Pain Management:    Induction: Intravenous, Rapid sequence and Cricoid pressure planned  Airway Management Planned: Oral ETT  Additional Equipment:   Intra-op Plan:   Post-operative Plan: Extubation in OR  Informed Consent: I have reviewed the patients History and Physical, chart, labs and discussed the procedure including the risks, benefits and alternatives for the proposed anesthesia with the patient or authorized representative who has indicated his/her understanding and acceptance.   Dental advisory given  Plan Discussed with: CRNA  Anesthesia Plan Comments:         Anesthesia Quick Evaluation

## 2014-08-25 NOTE — H&P (Signed)
Dorothy Marquez 08/07/2014 2:00 PM Location: [facility] Patient #: [id number] DOB: [birth year] Single / Language: Cleophus Molt / Race: White Female  History of Present Illness Randall Hiss M. Rosamaria Donn MD; 08/08/2014 2:41 PM) Patient words: bariatric.  The patient is a 29 year old female who presents for a pre-op visit. She comes in for her preoperative bariatric surgery visit. She is scheduled for Laparoscopic sleeve gastrectomy with possible hiatal hernia repair. I initially met her in November 2015. Her weight at that time was 367 pounds. Her UGI showed a small hiatal hernia. She denies any significant GERD. Her A1C was 5.1. Her h pylori was negative. total cholesterol was 172. LDL was 108. Her labs were otherwise unremarkable. She denies any significant medical changes since initially seen. She denies any new allergies, blood clots, trips to the ED or hospital, or medical diagnoses. She does endorse some recent anxiety issues. She is not anxious about her upcoming surgery. She states she is in fact thrilled that it is finally happening. She's not sure what is driving her anxiety. She is still working for same company but different location. She denies any home issues.   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 Medications Reconciled OxyCODONE HCl (5MG/5ML Solution, 5-10 Milliliter Oral every four hours, as needed, Taken starting 08/07/2014) Active.  Social History Gayland Curry, MD; 08/08/2014 2:48 PM) No drug use Tobacco use Never smoker. No alcohol use Caffeine use Tea.  Family History Gayland Curry, MD; 08/08/2014 2:48 PM) Alcohol Abuse Family Members In General. Depression Brother, Mother. Diabetes Mellitus Mother. Hypertension Mother. Kidney Disease Mother.  Pregnancy / Birth History Gayland Curry, MD; 08/08/2014 2:48 PM) Durenda Age 1 Maternal age 95-25 Para 56 Age at menarche 70 years. Regular periods  Review of Systems Gayland Curry, MD; 08/08/2014 2:49 00) General Present- Weight Gain and Weight Loss. Not Present- Appetite Loss, Chills, Fatigue, Fever and Night Sweats. Skin Not Present- Change in Wart/Mole, Dryness, Hives, Jaundice, New Lesions, Non-Healing Wounds, Rash and Ulcer. HEENT Not Present- Earache, Hearing Loss, Hoarseness, Nose Bleed, Oral Ulcers, Ringing in the Ears, Seasonal Allergies, Sinus Pain, Sore Throat, Visual Disturbances, Wears glasses/contact lenses and Yellow Eyes. Cardiovascular Not Present- Chest Pain, Difficulty Breathing Lying Down, Leg Cramps, Palpitations, Rapid Heart Rate, Shortness of Breath and Swelling of Extremities. Gastrointestinal Not Present- Abdominal Pain, Bloating, Bloody Stool, Change in Bowel Habits, Chronic diarrhea, Constipation, Difficulty Swallowing, Excessive gas, Gets full quickly at meals, Hemorrhoids, Indigestion, Nausea, Rectal Pain and Vomiting. Female Genitourinary Not Present- Frequency, Nocturia, Painful Urination, Pelvic Pain and Urgency. Neurological Not Present- Decreased Memory, Fainting, Headaches, Numbness, Seizures, Tingling, Tremor, Trouble walking and Weakness. Psychiatric Present-  Anxiety and Change in Sleep Pattern. Not Present- Bipolar, Depression, Fearful and Frequent crying. Endocrine Not Present- Cold Intolerance, Excessive Hunger, Hair Changes, Heat  Intolerance, Hot flashes and New Diabetes. Hematology Not Present- Easy Bruising, Excessive bleeding, Gland problems, HIV and Persistent Infections.   Vitals (Sonya Bynum CMA; 08/07/2014 2:04 PM) 08/07/2014 2:04 PM Weight: 369.2 lb Height: 63in Body Surface Area: 2.73 m Body Mass Index: 65.4 kg/m Temp.: 40F(Temporal)  Pulse: 81 (Regular)  BP: 128/78 (Sitting, Left Arm, Standard)    Physical Exam Randall Hiss M. Maloree Uplinger MD; 08/08/2014 2:31 PM) General Mental Status-Alert. General Appearance-Consistent with stated age. Hydration-Well hydrated. Voice-Normal. Note: Morbidly obese (CENTRAL TRUNCAL)   Head and Neck Head-normocephalic, atraumatic with no lesions or palpable masses. Trachea-midline. Thyroid Gland Characteristics - normal size and consistency.  Eye Eyeball - Bilateral-Extraocular movements intact. Sclera/Conjunctiva - Bilateral-No scleral icterus.  Chest and Lung Exam Chest and lung exam reveals -quiet, even and easy respiratory effort with no use of accessory muscles and on auscultation, normal breath sounds, no adventitious sounds and normal vocal resonance. Inspection Chest Wall - Normal. Back - normal.  Breast - Did not examine.  Cardiovascular Cardiovascular examination reveals -normal heart sounds, regular rate and rhythm with no murmurs and normal pedal pulses bilaterally.  Abdomen Inspection Inspection of the abdomen reveals - No Hernias. Skin - Scar - no surgical scars. Palpation/Percussion Palpation and Percussion of the abdomen reveal - Soft, Non Tender, No Rebound tenderness, No Rigidity (guarding) and No hepatosplenomegaly. Auscultation Auscultation of the abdomen reveals - Bowel sounds normal.  Peripheral Vascular Upper Extremity Palpation -  Pulses bilaterally normal.  Neurologic Neurologic evaluation reveals -alert and oriented x 3 with no impairment of recent or remote memory. Mental Status-Normal.  Neuropsychiatric The patient's mood and affect are described as -normal. Judgment and Insight-insight is appropriate concerning matters relevant to self.  Musculoskeletal Normal Exam - Left-Upper Extremity Strength Normal and Lower Extremity Strength Normal. Normal Exam - Right-Upper Extremity Strength Normal and Lower Extremity Strength Normal.  Lymphatic Head & Neck  General Head & Neck Lymphatics: Bilateral - Description - Normal. Axillary - Did not examine. Femoral & Inguinal - Did not examine.    Assessment & Plan Randall Hiss M. Idella Lamontagne MD; 08/08/2014 2:47 PM) MORBID OBESITY WITH BMI OF 60.0-69.9, ADULT (278.01  E66.01) Impression: We discussed her preoperative workup. We discussed finding of a small hiatal hernia. Since she does have some occasional gerd, I explained that we would test for a hiatal hernia during surgery and if we find a clinically significant one, then would recommend proceeding with repairing it at time of her sleeve surgery. We discussed what that would involve. We discussed typical hospital course and immediate postop course. We gave her her postop pain medication rx today. We also discussed her anxiety. She is mentally stable. We discussed breathing techniques. I also encouraged her to reach out to Dr Ardath Sax. Current Plans  Pt Education - EMW_preopbariatric Started OxyCODONE HCl 5MG/5ML, 5-10 Milliliter every four hours, as needed, 200 Milliliter, 08/07/2014, No Refill. MILD OBSTRUCTIVE SLEEP APNEA (327.23  G47.33) Impression: stable; epworth score 8 ELEVATED BLOOD PRESSURE (401.9  I10) Impression: stable DEPRESSION, CONTROLLED (311  F32.9) Impression: stable   Leighton Ruff. Redmond Pulling, MD, FACS General, Bariatric, & Minimally Invasive Surgery [facility], [place]

## 2014-08-25 NOTE — Interval H&P Note (Signed)
History and Physical Interval Note:  08/25/2014 10:47 AM  Dorothy Marquez  has presented today for surgery, with the diagnosis of Morbid Obesity  The various methods of treatment have been discussed with the patient and family. After consideration of risks, benefits and other options for treatment, the patient has consented to  Procedure(s): LAPAROSCOPIC GASTRIC SLEEVE RESECTION WITH HIATAL HERNIA REPAIR (N/A) UPPER GI ENDOSCOPY (N/A) as a surgical intervention .  The patient's history has been reviewed, patient examined, no change in status, stable for surgery.  I have reviewed the patient's chart and labs.  Questions were answered to the patient's satisfaction.    [name] Andrey Campanile, MD, FACS General, Bariatric, & Minimally Invasive Surgery [facility], [place]    [facility]

## 2014-08-25 NOTE — Anesthesia Procedure Notes (Addendum)
Procedure Name: Intubation Date/Time: 08/25/2014 11:06 AM Performed by: Ludwig Lean Pre-anesthesia Checklist: Patient identified, Emergency Drugs available, Suction available and Patient being monitored Patient Re-evaluated:Patient Re-evaluated prior to inductionOxygen Delivery Method: Circle System Utilized Preoxygenation: Pre-oxygenation with 100% oxygen Intubation Type: IV induction, Rapid sequence and Cricoid Pressure applied Ventilation: Mask ventilation without difficulty Laryngoscope Size: Mac and 3 Grade View: Grade I Tube type: Oral Tube size: 7.0 mm Number of attempts: 1 Airway Equipment and Method: Oral airway Placement Confirmation: ETT inserted through vocal cords under direct vision,  positive ETCO2 and breath sounds checked- equal and bilateral Secured at: 21 cm Tube secured with: Tape Dental Injury: Teeth and Oropharynx as per pre-operative assessment

## 2014-08-25 NOTE — Transfer of Care (Signed)
Immediate Anesthesia Transfer of Care Note  Patient: Dorothy Marquez  Procedure(s) Performed: Procedure(s): LAPAROSCOPIC GASTRIC SLEEVE RESECTION WITH HIATAL HERNIA REPAIR (N/A) UPPER GI ENDOSCOPY (N/A)  Patient Location: PACU  Anesthesia Type:General  Level of Consciousness: Patient easily awoken, sedated, comfortable, cooperative, following commands, responds to stimulation.   Airway & Oxygen Therapy: Patient spontaneously breathing, ventilating well, oxygen via simple oxygen mask.  Post-op Assessment: Report given to PACU RN, vital signs reviewed and stable, moving all extremities.   Post vital signs: Reviewed and stable.  Complications: No apparent anesthesia complications

## 2014-08-25 NOTE — Op Note (Signed)
Dorothy Marquez [id number] [birth year] 08/25/2014  Preoperative diagnosis: morbid obesity  Postoperative diagnosis: Same   Procedure: Upper endoscopy   Surgeon: Susy Frizzle B. Daphine Deutscher  M.D., FACS   Anesthesia: Gen.   Indications for procedure: This patient was undergoing a laparoscopic sleeve gastrectomy.  Purpose was to check for leaks and bleeding.      Description of procedure: The endoscopy was placed in the mouth and into the oropharynx and under endoscopic vision it was advanced to the esophagogastric junction.  The pouch was insufflated and the sleeve examined.  It had a uniform cylindrical appearance with very little proximal pouch.  .   No bleeding or leaks were detected.  The scope was withdrawn without difficulty.     Matt B. Daphine Deutscher, MD, FACS General, Bariatric, & Minimally Invasive Surgery [facility], [place]

## 2014-08-25 NOTE — Anesthesia Postprocedure Evaluation (Signed)
  Anesthesia Post-op Note  Patient: Dorothy Marquez  Procedure(s) Performed: Procedure(s) (LRB): LAPAROSCOPIC GASTRIC SLEEVE RESECTION WITH HIATAL HERNIA REPAIR (N/A) UPPER GI ENDOSCOPY (N/A)  Patient Location: PACU  Anesthesia Type: General  Level of Consciousness: awake and alert   Airway and Oxygen Therapy: Patient Spontanous Breathing  Post-op Pain: mild  Post-op Assessment: Post-op Vital signs reviewed, Patient's Cardiovascular Status Stable, Respiratory Function Stable, Patent Airway and No signs of Nausea or vomiting  Last Vitals:  Filed Vitals:   08/25/14 1523  BP: 135/79  Pulse: 58  Temp: 36.7 C  Resp: 18    Post-op Vital Signs: stable   Complications: No apparent anesthesia complications

## 2014-08-26 ENCOUNTER — Inpatient Hospital Stay (HOSPITAL_COMMUNITY): Payer: 59

## 2014-08-26 ENCOUNTER — Encounter (HOSPITAL_COMMUNITY): Payer: Self-pay | Admitting: General Surgery

## 2014-08-26 DIAGNOSIS — R030 Elevated blood-pressure reading, without diagnosis of hypertension: Secondary | ICD-10-CM

## 2014-08-26 DIAGNOSIS — Z6841 Body Mass Index (BMI) 40.0 and over, adult: Secondary | ICD-10-CM

## 2014-08-26 DIAGNOSIS — IMO0001 Reserved for inherently not codable concepts without codable children: Secondary | ICD-10-CM | POA: Diagnosis present

## 2014-08-26 DIAGNOSIS — E785 Hyperlipidemia, unspecified: Secondary | ICD-10-CM | POA: Diagnosis present

## 2014-08-26 LAB — CBC WITH DIFFERENTIAL/PLATELET
BASOS PCT: 0 % (ref 0–1)
Basophils Absolute: 0 10*3/uL (ref 0.0–0.1)
EOS ABS: 0 10*3/uL (ref 0.0–0.7)
Eosinophils Relative: 0 % (ref 0–5)
HCT: 43.2 % (ref 36.0–46.0)
Hemoglobin: 14.1 g/dL (ref 12.0–15.0)
Lymphocytes Relative: 5 % — ABNORMAL LOW (ref 12–46)
Lymphs Abs: 0.7 10*3/uL (ref 0.7–4.0)
MCH: 31.3 pg (ref 26.0–34.0)
MCHC: 32.6 g/dL (ref 30.0–36.0)
MCV: 96 fL (ref 78.0–100.0)
Monocytes Absolute: 1.2 10*3/uL — ABNORMAL HIGH (ref 0.1–1.0)
Monocytes Relative: 9 % (ref 3–12)
Neutro Abs: 11.8 10*3/uL — ABNORMAL HIGH (ref 1.7–7.7)
Neutrophils Relative %: 86 % — ABNORMAL HIGH (ref 43–77)
Platelets: 332 10*3/uL (ref 150–400)
RBC: 4.5 MIL/uL (ref 3.87–5.11)
RDW: 12.9 % (ref 11.5–15.5)
WBC: 13.8 10*3/uL — ABNORMAL HIGH (ref 4.0–10.5)

## 2014-08-26 LAB — COMPREHENSIVE METABOLIC PANEL
ALBUMIN: 3.6 g/dL (ref 3.5–5.0)
ALT: 26 U/L (ref 14–54)
ANION GAP: 8 (ref 5–15)
AST: 26 U/L (ref 15–41)
Alkaline Phosphatase: 72 U/L (ref 38–126)
BILIRUBIN TOTAL: 0.7 mg/dL (ref 0.3–1.2)
BUN: 7 mg/dL (ref 6–20)
CHLORIDE: 105 mmol/L (ref 101–111)
CO2: 25 mmol/L (ref 22–32)
Calcium: 9.1 mg/dL (ref 8.9–10.3)
Creatinine, Ser: 0.62 mg/dL (ref 0.44–1.00)
GFR calc Af Amer: >60 mL/min (ref >60)
GFR calc non Af Amer: >60 mL/min (ref >60)
Glucose, Bld: 138 mg/dL — ABNORMAL HIGH (ref 65–99)
Potassium: 4.6 mmol/L (ref 3.5–5.1)
SODIUM: 138 mmol/L (ref 135–145)
Total Protein: 6.8 g/dL (ref 6.5–8.1)

## 2014-08-26 LAB — HEMOGLOBIN AND HEMATOCRIT, BLOOD
HCT: 42.9 % (ref 36.0–46.0)
HEMOGLOBIN: 13.6 g/dL (ref 12.0–15.0)

## 2014-08-26 MED ORDER — IOHEXOL 300 MG/ML  SOLN
50.0000 mL | Freq: Once | INTRAMUSCULAR | Status: AC | PRN
  Administered 2014-08-26: 10 mL via ORAL

## 2014-08-26 NOTE — Plan of Care (Signed)
Problem: Food- and Nutrition-Related Knowledge Deficit (NB-1.1) Goal: Nutrition education Formal process to instruct or train a patient/client in a skill or to impart knowledge to help patients/clients voluntarily manage or modify food choices and eating behavior to maintain or improve health. Outcome: Progressing RD consulted for nutrition education regarding gastric bypass diet.  Body mass index is 61.68 kg/(m^2). Pt meets criteria for obese based on current BMI.  RD provided "Gastric Bypass Post-op Diet" handout from [facility]. Emphasized the importance of following diet parameters. Discussed importance of controlled and consistent intake throughout the day in specified amounts.Emphasized the importance of hydration with calorie-free beverages. Encouraged pt to discuss questions or concerns with inpatient dietitian or through follow-up with outpatient dietitian.   Expect good compliance.  Patient denies questions.  She does report pain and cramping, but states she was able to get OOB today.  Has not passed any gas since surgery.  Current diet order is Bypass POD #1. Labs and medications reviewed. No further nutrition interventions warranted at this time. RD contact information provided. If additional nutrition issues arise, please re-consult RD.  Loyce Dys, MS RD LDN Clinical Inpatient Dietitian Weekend/After hours pager: [phone]

## 2014-08-26 NOTE — Progress Notes (Signed)
Patient alert and oriented, Post op day 1.  Provided support and encouragement.  Encouraged pulmonary toilet, ambulation and small sips of liquids when swallow study returned satisfactorily.  All questions answered.  Will continue to monitor. 

## 2014-08-26 NOTE — Progress Notes (Signed)
1 Day Post-Op  Subjective: Having some nausea and epigastric cramps. Ambulated yesterday, not today. Pain ok  Objective: Vital signs in last 24 hours: Temp:  [97.8 F (36.6 C)-99.8 F (37.7 C)] 98.4 F (36.9 C) (06/21 0900) Pulse Rate:  [55-95] 70 (06/21 0900) Resp:  [17-24] 18 (06/21 0900) BP: (125-178)/(64-109) 125/84 mmHg (06/21 0900) SpO2:  [99 %-100 %] 100 % (06/21 0900) Last BM Date: 08/24/14  Intake/Output from previous day: 06/20 0701 - 06/21 0700 In: 4485 [I.V.:4375; IV Piggyback:110] Out: 2700 [Urine:2600; Emesis/NG output:100] Intake/Output this shift: Total I/O In: 560 [I.V.:500; IV Piggyback:60] Out: 200 [Urine:150; Emesis/NG output:50]  Alert, nontoxic, not ill appearing cta b/l Reg Soft, obese, bruising around trocar site, min TTP No edema  Lab Results:   Recent Labs  08/25/14 1615 08/26/14 0435  WBC  --  13.8*  HGB 14.2 14.1  HCT 43.2 43.2  PLT  --  332   BMET  Recent Labs  08/26/14 0435  NA 138  K 4.6  CL 105  CO2 25  GLUCOSE 138*  BUN 7  CREATININE 0.62  CALCIUM 9.1   PT/INR No results for input(s): LABPROT, INR in the last 72 hours. ABG No results for input(s): PHART, HCO3 in the last 72 hours.  Invalid input(s): PCO2, PO2  Studies/Results: Dg Ugi W/water Sol Cm  08/26/2014   CLINICAL DATA:  Postop gastric sleeve procedure.  EXAM: WATER SOLUBLE UPPER GI SERIES  TECHNIQUE: Single-column upper GI series was performed using water soluble contrast.  CONTRAST:  57mL OMNIPAQUE IOHEXOL 300 MG/ML  SOLN  COMPARISON:  Preop study 02/06/2014  FLUOROSCOPY TIME:  Radiation Exposure Index (as provided by the fluoroscopic device):  If the device does not provide the exposure index:  Fluoroscopy Time (in minutes and seconds):  1 minutes and 53 seconds  Number of Acquired Images:  FINDINGS: The distal esophagus is unremarkable. No complicating features from the gastric sleeve procedure. No leaking contrast material. Normal appearance of the stomach  and drainage into the duodenum.  IMPRESSION: Unremarkable postop gastric sleeve upper GI.   Electronically Signed   By: Rudie Meyer M.D.   On: 08/26/2014 10:24    Anti-infectives: Anti-infectives    Start     Dose/Rate Route Frequency Ordered Stop   08/25/14 0907  cefoTEtan in Dextrose 5% (CEFOTAN) IVPB 2 g     2 g Intravenous On call to O.R. 08/25/14 0907 08/25/14 1112      Assessment/Plan: s/p Procedure(s): LAPAROSCOPIC GASTRIC SLEEVE RESECTION WITH HIATAL HERNIA REPAIR (N/A) UPPER GI ENDOSCOPY (N/A)  Expected nausea/cramps Vitals ok UGI looks ok Start POD 1 diet Ambulate, vte prophylaxis  [name] Andrey Campanile, MD, FACS General, Bariatric, & Minimally Invasive Surgery [facility], [place]   LOS: 1 day    [name] 08/26/2014

## 2014-08-27 LAB — CBC WITH DIFFERENTIAL/PLATELET
BASOS PCT: 0 % (ref 0–1)
Basophils Absolute: 0 10*3/uL (ref 0.0–0.1)
EOS ABS: 0 10*3/uL (ref 0.0–0.7)
Eosinophils Relative: 0 % (ref 0–5)
HCT: 41.3 % (ref 36.0–46.0)
Hemoglobin: 13.3 g/dL (ref 12.0–15.0)
LYMPHS ABS: 1.8 10*3/uL (ref 0.7–4.0)
Lymphocytes Relative: 19 % (ref 12–46)
MCH: 31.4 pg (ref 26.0–34.0)
MCHC: 32.2 g/dL (ref 30.0–36.0)
MCV: 97.4 fL (ref 78.0–100.0)
Monocytes Absolute: 1.1 10*3/uL — ABNORMAL HIGH (ref 0.1–1.0)
Monocytes Relative: 11 % (ref 3–12)
Neutro Abs: 6.9 10*3/uL (ref 1.7–7.7)
Neutrophils Relative %: 70 % (ref 43–77)
Platelets: 307 10*3/uL (ref 150–400)
RBC: 4.24 MIL/uL (ref 3.87–5.11)
RDW: 13 % (ref 11.5–15.5)
WBC: 9.9 10*3/uL (ref 4.0–10.5)

## 2014-08-27 NOTE — Care Management Note (Signed)
Case Management Note  Patient Details  Name: Dorothy Marquez MRN: [id number] Date of Birth: 06-07-1985  Subjective/Objective:             Admitted s/p sleeve gastrectomy       Action/Plan: Discharge planning  Expected Discharge Date:                  Expected Discharge Plan:  Home/Self Care  In-House Referral:  NA  Discharge planning Services  CM Consult  Post Acute Care Choice:    Choice offered to:     DME Arranged:    DME Agency:     HH Arranged:    HH Agency:     Status of Service:  Completed, signed off  Medicare Important Message Given:  N/A - LOS <3 / Initial given by admissions Date Medicare IM Given:    Medicare IM give by:    Date Additional Medicare IM Given:    Additional Medicare Important Message give by:     If discussed at Long Length of Stay Meetings, dates discussed:    Additional Comments:  Alexis Goodell, RN 08/27/2014, 9:51 AM

## 2014-08-27 NOTE — Progress Notes (Signed)
2 Days Post-Op  Subjective: Some nausea. Vomited after oral pain med. Min pain. Ambulated. O/w tolerated water  Objective: Vital signs in last 24 hours: Temp:  [98.2 F (36.8 C)-99.1 F (37.3 C)] 98.2 F (36.8 C) (06/22 0554) Pulse Rate:  [70-99] 85 (06/22 0554) Resp:  [16-18] 16 (06/22 0554) BP: (116-144)/(51-84) 128/58 mmHg (06/22 0554) SpO2:  [97 %-100 %] 100 % (06/22 0554) Last BM Date: 08/24/14  Intake/Output from previous day: 06/21 0701 - 06/22 0700 In: 2120 [I.V.:2000; IV Piggyback:120] Out: 6834 [Urine:3700; Emesis/NG output:50] Intake/Output this shift:    Alert, not ill appearing cta b/l Reg Soft, nd, incisions c/d/i; bruising at extraction site No edema  Lab Results:   Recent Labs  08/26/14 0435 08/26/14 1645 08/27/14 0454  WBC 13.8*  --  9.9  HGB 14.1 13.6 13.3  HCT 43.2 42.9 41.3  PLT 332  --  307   BMET  Recent Labs  08/26/14 0435  NA 138  K 4.6  CL 105  CO2 25  GLUCOSE 138*  BUN 7  CREATININE 0.62  CALCIUM 9.1   Hepatic Function Latest Ref Rng 08/26/2014 08/21/2014 01/22/2014  Total Protein 6.5 - 8.1 g/dL 6.8 7.2 6.2  Albumin 3.5 - 5.0 g/dL 3.6 4.0 3.7  AST 15 - 41 U/L '26 20 23  ' ALT 14 - 54 U/L '26 20 21  ' Alk Phosphatase 38 - 126 U/L 72 81 78  Total Bilirubin 0.3 - 1.2 mg/dL 0.7 0.6 0.6     PT/INR No results for input(s): LABPROT, INR in the last 72 hours. ABG No results for input(s): PHART, HCO3 in the last 72 hours.  Invalid input(s): PCO2, PO2  Studies/Results: Dg Ugi W/water Sol Cm  08/26/2014   CLINICAL DATA:  Postop gastric sleeve procedure.  EXAM: WATER SOLUBLE UPPER GI SERIES  TECHNIQUE: Single-column upper GI series was performed using water soluble contrast.  CONTRAST:  37m OMNIPAQUE IOHEXOL 300 MG/ML  SOLN  COMPARISON:  Preop study 02/06/2014  FLUOROSCOPY TIME:  Radiation Exposure Index (as provided by the fluoroscopic device):  If the device does not provide the exposure index:  Fluoroscopy Time (in minutes and  seconds):  1 minutes and 53 seconds  Number of Acquired Images:  FINDINGS: The distal esophagus is unremarkable. No complicating features from the gastric sleeve procedure. No leaking contrast material. Normal appearance of the stomach and drainage into the duodenum.  IMPRESSION: Unremarkable postop gastric sleeve upper GI.   Electronically Signed   By: PMarijo SanesM.D.   On: 08/26/2014 10:24    Anti-infectives: Anti-infectives    Start     Dose/Rate Route Frequency Ordered Stop   08/25/14 0907  cefoTEtan in Dextrose 5% (CEFOTAN) IVPB 2 g     2 g Intravenous On call to O.R. 08/25/14 0907 08/25/14 1112      Assessment/Plan: s/p Procedure(s): LAPAROSCOPIC GASTRIC SLEEVE RESECTION WITH HIATAL HERNIA REPAIR (N/A) UPPER GI ENDOSCOPY (N/A)  Will see how does this am and make decision about dc Discussed dc instructions If has ongoing emesis after oral pain med - will need to change oral pain med Cont pulm toilet  ELeighton Ruff WRedmond Pulling MD, FACS General, Bariatric, & Minimally Invasive Surgery [facility] [place]  LOS: 2 days    WGayland Curry6/22/2016

## 2014-08-28 DIAGNOSIS — G4733 Obstructive sleep apnea (adult) (pediatric): Secondary | ICD-10-CM | POA: Diagnosis present

## 2014-08-28 MED ORDER — OXYCODONE HCL 5 MG/5ML PO SOLN: 5.0000 mg | ORAL | Status: DC | PRN

## 2014-08-28 MED ORDER — ENOXAPARIN (LOVENOX) PATIENT EDUCATION KIT
PACK | Freq: Once | Status: AC
  Administered 2014-08-28: 11:00:00
  Filled 2014-08-28: qty 1

## 2014-08-28 MED ORDER — ONDANSETRON 4 MG PO TBDP: 4.0000 mg | ORAL_TABLET | Freq: Three times a day (TID) | ORAL | Status: DC | PRN

## 2014-08-28 MED ORDER — ENOXAPARIN SODIUM 40 MG/0.4ML ~~LOC~~ SOLN: 40.0000 mg | SUBCUTANEOUS | Status: DC

## 2014-08-28 NOTE — Progress Notes (Signed)
3 Days Post-Op  Subjective: Started feeling much better late yesterday pm. Tolerating shake and water. Ambulated multiple times. Min pain.   Objective: Vital signs in last 24 hours: Temp:  [98.2 F (36.8 C)-99 F (37.2 C)] 98.7 F (37.1 C) (06/23 0517) Pulse Rate:  [66-93] 71 (06/23 0517) Resp:  [16-18] 16 (06/23 0517) BP: (108-138)/(49-76) 137/70 mmHg (06/23 0517) SpO2:  [95 %-100 %] 100 % (06/23 0517) Last BM Date: 08/24/14  Intake/Output from previous day: 06/22 0701 - 06/23 0700 In: 4120 [P.O.:60; I.V.:4000; IV Piggyback:60] Out: 4200 [Urine:4200] Intake/Output this shift:    Asleep, easily awake, nad cta b/l Reg Soft, nt, nd, bruising at extraction site No edema  Lab Results:   Recent Labs  08/26/14 0435 08/26/14 1645 08/27/14 0454  WBC 13.8*  --  9.9  HGB 14.1 13.6 13.3  HCT 43.2 42.9 41.3  PLT 332  --  307   BMET  Recent Labs  08/26/14 0435  NA 138  K 4.6  CL 105  CO2 25  GLUCOSE 138*  BUN 7  CREATININE 0.62  CALCIUM 9.1   PT/INR No results for input(s): LABPROT, INR in the last 72 hours. ABG No results for input(s): PHART, HCO3 in the last 72 hours.  Invalid input(s): PCO2, PO2  Studies/Results: Dg Ugi W/water Sol Cm  08/26/2014   CLINICAL DATA:  Postop gastric sleeve procedure.  EXAM: WATER SOLUBLE UPPER GI SERIES  TECHNIQUE: Single-column upper GI series was performed using water soluble contrast.  CONTRAST:  62mL OMNIPAQUE IOHEXOL 300 MG/ML  SOLN  COMPARISON:  Preop study 02/06/2014  FLUOROSCOPY TIME:  Radiation Exposure Index (as provided by the fluoroscopic device):  If the device does not provide the exposure index:  Fluoroscopy Time (in minutes and seconds):  1 minutes and 53 seconds  Number of Acquired Images:  FINDINGS: The distal esophagus is unremarkable. No complicating features from the gastric sleeve procedure. No leaking contrast material. Normal appearance of the stomach and drainage into the duodenum.  IMPRESSION: Unremarkable  postop gastric sleeve upper GI.   Electronically Signed   By: Rudie Meyer M.D.   On: 08/26/2014 10:24    Anti-infectives: Anti-infectives    Start     Dose/Rate Route Frequency Ordered Stop   08/25/14 0907  cefoTEtan in Dextrose 5% (CEFOTAN) IVPB 2 g     2 g Intravenous On call to O.R. 08/25/14 0907 08/25/14 1112      Assessment/Plan: s/p Procedure(s): LAPAROSCOPIC GASTRIC SLEEVE RESECTION WITH HIATAL HERNIA REPAIR (N/A) UPPER GI ENDOSCOPY (N/A)   Nausea much improved. Tolerating shake better.  Will plan on dc later this am rediscussed dc instructions and importance of walking  Dorothy Sella. Andrey Campanile, MD, FACS General, Bariatric, & Minimally Invasive Surgery [facility], [place]   LOS: 3 days    Dorothy Marquez 08/28/2014

## 2014-08-28 NOTE — Progress Notes (Signed)
Patient alert and oriented, pain is controlled. Patient is tolerating fluids, advanced to protein shake yesterday, patient is tolerating well today.  Reviewed Gastric sleeve discharge instructions with patient and patient is able to articulate understanding.  Provided information on BELT program, Support Group and [facility] outpatient pharmacy. All questions answered, will continue to monitor.  

## 2014-08-28 NOTE — Discharge Instructions (Addendum)
° ° ° °GASTRIC BYPASS/SLEEVE ° Home Care Instructions ° ° These instructions are to help you care for yourself when you go home. ° °Call: If you have any problems. °• Call [phone] and ask for the surgeon on call °• If you need immediate assistance come to the ER at [facility]. Tell the ER staff you are a new post-op gastric bypass or gastric sleeve patient  °Signs and symptoms to report: • Severe  vomiting or nausea °o If you cannot handle clear liquids for longer than 1 day, call your surgeon °• Abdominal pain which does not get better after taking your pain medication °• Fever greater than 100.4°  F and chills °• Heart rate over 100 beats a minute °• Trouble breathing °• Chest pain °• Redness,  swelling, drainage, or foul odor at incision (surgical) sites °• If your incisions open or pull apart °• Swelling or pain in calf (lower leg) °• Diarrhea (Loose bowel movements that happen often), frequent watery, uncontrolled bowel movements °• Constipation, (no bowel movements for 3 days) if this happens: °o Take Milk of Magnesia, 2 tablespoons by mouth, 3 times a day for 2 days if needed °o Stop taking Milk of Magnesia once you have had a bowel movement °o Call your doctor if constipation continues °Or °o Take Miralax  (instead of Milk of Magnesia) following the label instructions °o Stop taking Miralax once you have had a bowel movement °o Call your doctor if constipation continues °• Anything you think is “abnormal for you” °  °Normal side effects after surgery: • Unable to sleep at night or unable to concentrate °• Irritability °• Being tearful (crying) or depressed ° °These are common complaints, possibly related to your anesthesia, stress of surgery, and change in lifestyle, that usually go away a few weeks after surgery. If these feelings continue, call your medical doctor.  °Wound Care: You may have surgical glue, steri-strips, or staples over your incisions after surgery °• Surgical glue: Looks like clear  film over your incisions and will wear off a little at a time °• Steri-strips: Adhesive strips of tape over your incisions. You may notice a yellowish color on skin under the steri-strips. This is used to make the steri-strips stick better. Do not pull the steri-strips off - let them fall off °• Staples: Staples may be removed before you leave the hospital °o If you go home with staples, call [facility] for an appointment with your surgeon’s nurse to have staples removed 10 days after surgery, [phone] °• Showering: You may shower two (2) days after your surgery unless your surgeon tells you differently °o Wash gently around incisions with warm soapy water, rinse well, and gently pat dry °o If you have a drain (tube from your incision), you may need someone to hold this while you shower °o No tub baths until staples are removed and incisions are healed °  °Medications: • Medications should be liquid or crushed if larger than the size of a dime °• Extended release pills (medication that releases a little bit at a time through the  day) should not be crushed °• Depending on the size and number of medications you take, you may need to space (take a few throughout the day)/change the time you take your medications so that you do not over-fill your pouch (smaller stomach) °• Make sure you follow-up with you primary care physician to make medication changes needed during rapid weight loss and life -style changes °•   If you have diabetes, follow up with your doctor that orders your diabetes medication(s) within one week after surgery and check your blood sugar regularly ° °• Do not drive while taking narcotics (pain medications) ° °• Do not take acetaminophen (Tylenol) and Roxicet or Lortab Elixir at the same time since these pain medications contain acetaminophen °  °Diet:  °First 2 Weeks You will see the nutritionist about two (2) weeks after your surgery. The nutritionist will increase the types of  foods you can eat if you are handling liquids well: °• If you have severe vomiting or nausea and cannot handle clear liquids lasting longer than 1 day call your surgeon °Protein Shake °• Drink at least 2 ounces of shake 5-6 times per day °• Each serving of protein shakes (usually 8-12 ounces) should have a minimum of: °o 15 grams of protein °o And no more than 5 grams of carbohydrate °• Goal for protein each day: °o Men = 80 grams per day °o Women = 60 grams per day °  ° • Protein powder may be added to fluids such as non-fat milk or Lactaid milk or Soy milk (limit to 35 grams added protein powder per serving) ° °Hydration °• Slowly increase the amount of water and other clear liquids as tolerated (See Acceptable Fluids) °• Slowly increase the amount of protein shake as tolerated °• Sip fluids slowly and throughout the day °• May use sugar substitutes in small amounts (no more than 6-8 packets per day; i.e. Splenda) ° °Fluid Goal °• The first goal is to drink at least 8 ounces of protein shake/drink per day (or as directed by the nutritionist); some examples of protein shakes are Syntrax Nectar, Adkins Advantage, EAS Edge HP, and Unjury. - See handout from pre-op Bariatric Education Class: °o Slowly increase the amount of protein shake you drink as tolerated °o You may find it easier to slowly sip shakes throughout the day °o It is important to get your proteins in first °• Your fluid goal is to drink 64-100 ounces of fluid daily °o It may take a few weeks to build up to this  °• 32 oz. (or more) should be clear liquids °And °• 32 oz. (or more) should be full liquids (see below for examples) °• Liquids should not contain sugar, caffeine, or carbonation ° °Clear Liquids: °• Water of Sugar-free flavored water (i.e. Fruit H²O, Propel) °• Decaffeinated coffee or tea (sugar-free) °• Crystal lite, Wyler’s Lite, Minute Maid Lite °• Sugar-free Jell-O °• Bouillon or broth °• Sugar-free Popsicle:    - Less than 20 calories  each; Limit 1 per day ° °Full Liquids: °                  Protein Shakes/Drinks + 2 choices per day of other full liquids °• Full liquids must be: °o No More Than 12 grams of Carbs per serving °o No More Than 3 grams of Fat per serving °• Strained low-fat cream soup °• Non-Fat milk °• Fat-free Lactaid Milk °• Sugar-free yogurt (Dannon Lite & Fit, Greek yogurt) ° °  °Vitamins and Minerals • Start 1 day after surgery unless otherwise directed by your surgeon °• 2 Chewable Multivitamin / Multimineral Supplement with iron (i.e. Centrum for Adults) °• Vitamin B-12, 350-500 micrograms sub-lingual (place tablet under the tongue) each day °• Chewable Calcium Citrate with Vitamin D-3 °(Example: 3 Chewable Calcium  Plus 600 with Vitamin D-3) °o Take 500 mg three (3) times a day for   a total of 1500 mg each day °o Do not take all 3 doses of calcium at one time as it may cause constipation, and you can only absorb 500 mg at a time °o Do not mix multivitamins containing iron with calcium supplements;  take 2 hours apart °o Do not substitute Tums (calcium carbonate) for your calcium °• Menstruating women and those at risk for anemia ( a blood disease that causes weakness) may need extra iron °o Talk to your doctor to see if you need more iron °• If you need extra iron: Total daily Iron recommendation (including Vitamins) is 50 to 100 mg Iron/day °• Do not stop taking or change any vitamins or minerals until you talk to your nutritionist or surgeon °• Your nutritionist and/or surgeon must approve all vitamin and mineral supplements °  °Activity and Exercise: It is important to continue walking at home. Limit your physical activity as instructed by your doctor. During this time, use these guidelines: °• Do not lift anything greater than ten  (10) pounds for at least two (2) weeks °• Do not go back to work or drive until your surgeon says you can °• You may have sex when you feel comfortable °o It is VERY important for female  patients to use a reliable birth control method; fertility often increase after surgery °o Do not get pregnant for at least 18 months °• Start exercising as soon as your doctor tells you that you can °o Make sure your doctor approves any physical activity °• Start with a simple walking program °• Walk 5-15 minutes each day, 7 days per week °• Slowly increase until you are walking 30-45 minutes per day °• Consider joining our BELT program. [phone] or email [email or web address] °  °Special Instructions Things to remember: °• Free counseling is available for you and your family through collaboration between [facility] and [employer]. Please call [phone] and leave a message °• Use your CPAP when sleeping if this applies to you °• Consider buying a medical alert bracelet that says you had lap-band surgery °  °  You will likely have your first fill (fluid added to your band) 6 - 8 weeks after surgery °• [facility] has a free Bariatric Surgery Support Group that meets monthly, the 3rd Thursday, 6pm. [facility] Classrooms. You can see classes online at [email or web address] °• It is very important to keep all follow up appointments with your surgeon, nutritionist, primary care physician, and behavioral health practitioner °o After the first year, please follow up with your bariatric surgeon and nutritionist at least once a year in order to maintain best weight loss results °      °             [facility]:  [phone] ° °             [facility]: [phone] ° °             Bariatric Nurse Coordinator: [phone]  °Gastric Bypass/Sleeve Home Care Instructions  Rev. 04/2012    ° °                                                    Reviewed and Endorsed °                                                     by [facility] Patient Education Committee, Jan, 2014 ° ° ° ° ° ° ° ° °Enoxaparin, Home Use °Enoxaparin (Lovenox®) injection is a  medication used to prevent clots from developing in your veins. Medications such as enoxaparin are called blood thinners or anticoagulants. If blood clots are untreated they could travel to your lungs. This is called a pulmonary embolus. A blood clot in your lungs can be fatal. °Caregivers often use anticoagulants such as enoxaparin to prevent clots following surgery. It is also used along with aspirin when the heart is not getting enough blood. °Continue the enoxaparin injections as directed by your caregiver. Your caregiver will use blood clotting test results to decide when you can safely stop using enoxaparin injections. If your caregiver prescribes any additional anticoagulant, you must take it exactly as directed. °RISKS AND COMPLICATIONS °· If you have received recent epidural anesthesia, spinal anesthesia, or a spinal tap while receiving anticoagulants, you are at risk for developing a blood clot in or around the spine. This condition could result in long-term or permanent paralysis. °· Because anticoagulants thin your blood, severe bleeding may occur from any tissue or organ. Symptoms of the blood being too thin may include: °¨ Bleeding from the nose or gums that does not stop quickly. °¨ Unusual bruising or bruising easily. °¨ Swelling or pain at an injection site. °¨ A cut that does not stop bleeding within 10 minutes. °¨ Continual nausea for more than 1 day or vomiting blood. °¨ Coughing up blood. °¨ Blood in the urine which may appear as pink, red, or brown urine. °¨ Blood in bowel movements which may appear as red, dark or black stools. °¨ Sudden weakness or numbness of the face, arm, or leg, especially on one side of the body. °¨ Sudden confusion. °¨ Trouble speaking (aphasia) or understanding. °¨ Sudden trouble seeing in one or both eyes. °¨ Sudden trouble walking. °¨ Dizziness. °¨ Loss of balance or coordination. °¨ Severe pain, such as a headache, joint pain, or back pain. °¨ Fever. °· Bruising  around the injection sites may be expected. °· Platelet drops, known as "thrombocytopenia," can occur with enoxaparin use. A condition called "heparin-induced thrombocytopenia" has been seen. If you have had this condition, you should tell your caregiver. Your caregiver may direct you to have blood tests to monitor this condition. °· Do not use if you have allergies to the medication, heparin, or pork products. °· Other side effects may include mild local reactions or irritation at the site of injection, pain, bruising, and redness of skin. °HOME CARE INSTRUCTIONS °You will be instructed by your caregiver how to give enoxaparin injections. °1. Before giving your medication you should make sure the injection is a clear and colorless or pale yellow solution. If your medication becomes discolored or has particles in the bottle, do not use and notify your caregiver. °2. When using the 30 and 40 mg pre-filled syringes, do not expel the air bubble from the syringe before the injection. This makes sure you use all the medication in the syringe. °3. The injections will be given subcutaneously. This means it is given into the fat over the belly (abdomen). It is given deep beneath the skin but not into the muscle. The shots should be injected around the abdominal wall. Change the sites of injection each time. The whole length of the needle should be introduced into a skin fold held between the thumb and forefinger; the skin fold should be held throughout the injection. Do   not rub the injection site after completion of the injection. This increases bruising. Enoxaparin injection pre-filled syringes and graduated pre-filled syringes are available with a system that shields the needle after injection. °4. Inject by pushing the plunger to the bottom of the syringe. °5. Remove the syringe from the injection site keeping your finger on the plunger rod. Be careful not to stick yourself or others. °6. After injection and the syringe  is empty, set off the safety system by firmly pushing the plunger rod. The protective sleeve will automatically cover the needle and you can hear a click. The click means your needle is safely covered. Do not try replacing the needle shield. °7. Get rid of the syringe in the nearest sharps container. °8. Keep your medication safely stored at room temperatures. °· Due to the complications of anticoagulants, it is very important that you take your anticoagulant as directed by your caregiver. Anticoagulants need to be taken exactly as instructed. Be sure you understand all your anticoagulant instructions. °· Changes in medicines, supplements, diet, and illness can affect your anticoagulation therapy. Be sure to inform your caregivers of any of these changes. °· While on anticoagulants, you will need to have blood tests done routinely as directed by your caregivers. °· Be careful not to cut yourself when using sharp objects. °· Limit physical activities or sports that could result in a fall or cause injury. °· It is extremely important that you tell all of your caregivers and dentist that you are taking an anticoagulant, especially if you are injured or plan to have any type of procedure or operation. °· Follow up with your laboratory test and caregiver appointments as directed. It is very important to keep your appointments. Not keeping appointments could result in a chronic or permanent injury, pain, or disability. °SEEK MEDICAL CARE IF: °· You develop any rashes. °· You have any worsening of the condition for which you are receiving anticoagulation therapy. °SEEK IMMEDIATE MEDICAL CARE IF: °· Bleeding from the nose or gums does not stop quickly. °· You have unusual bruising or are bruising easily. °· Swelling or pain occurs at an injection site. °· A cut does not stop bleeding within 10 minutes. °· You have continual nausea for more than 1 day or are vomiting blood. °· You are coughing up blood. °· You have blood in  the urine. °· You have dark or black stools. °· You have sudden weakness or numbness of the face, arm, or leg, especially on one side of the body. °· You have sudden confusion. °· You have trouble speaking (aphasia) or understanding. °· You have sudden trouble seeing in one or both eyes. °· You have sudden trouble walking. °· You have dizziness. °· You have a loss of balance or coordination. °· You have severe pain, such as a headache, joint pain, or back pain. °· You have a serious fall or head injury, even if you are not bleeding. °· You have an oral temperature above 102° F (38.9° C), not controlled by medicine. °ANY OF THESE SYMPTOMS MAY REPRESENT A SERIOUS PROBLEM THAT IS AN EMERGENCY. Do not wait to see if the symptoms will go away. Get medical help right away. Call your local emergency services (911 in [place] DO NOT drive yourself to the hospital. °MAKE SURE YOU: °· Understand these instructions. °· Will watch your condition. °· Will get help right away if you are not doing well or get worse. °Document Released: 12/24/2003 Document Revised: 05/16/2011 Document Reviewed: 02/21/2005 °  ExitCare® Patient Information ©2015 [employer]. This information is not intended to replace advice given to you by your health care provider. Make sure you discuss any questions you have with your health care provider. ° °

## 2014-08-29 NOTE — Discharge Summary (Signed)
Physician Discharge Summary  Dorothy Marquez EAV:[id number] DOB: 15-Nov-1985 DOA: 08/25/2014  PCP: Leretha Pol, PA-C  Admit date: 08/25/2014 Discharge date: 08/28/2014  Recommendations for Outpatient Follow-up:    Follow-up Information    Follow up with Dorothy Ina, MD. Go on 09/04/2014.   Specialty:  General Surgery   Why:  For Post-Op Check at 11:15 AM   Contact information:   [address] [place] [address] [phone]      Discharge Diagnoses:  Principal Problem:   Morbid obesity with BMI of 60.0-69.9, adult Active Problems:   S/P laparoscopic sleeve gastrectomy   Dyslipidemia   Elevated blood pressure   Mild obstructive sleep apnea   Surgical Procedure: Laparoscopic Sleeve Gastrectomy, upper endoscopy  Discharge Condition: Good Disposition: Home  Diet recommendation: Postoperative sleeve gastrectomy diet (liquids only)  Filed Weights   08/25/14 0909  Weight: 163.068 kg (359 lb 8 oz)     Hospital Course:  The patient was admitted for a planned laparoscopic sleeve gastrectomy. Please see operative note. Preoperatively the patient was given 5000 units of subcutaneous heparin for DVT prophylaxis. Postoperative prophylactic Lovenox dosing was started on the morning of postoperative day 1. The patient underwent an upper GI on postoperative day 1 which demonstrated no extravasation of contrast and emptying of the contrast into the small bowel. The patient was started on ice chips and water which they tolerated but had fair amount of nausea. On postoperative day 2 The patient's diet was advanced to protein shakes which they also tolerated however she was not taking adequate PO where we felt comfortable discharging her. On POD 3, The patient was ambulating without difficulty. Their vital signs are stable without fever or tachycardia. Their hemoglobin had remained stable. Her nausea had significantly improved and was taking in more PO. The patient had received  discharge instructions and counseling. They were deemed stable for discharge.  Using [facility]'s post discharge risk calculator for VTE after bariatric surgery, I decided to send her home on extended DVT prophylaxis for 2 weeks consisting of once a day dosing of  lovenox. She received teaching instructions on lovenox.    Discharge Instructions  Discharge Instructions    Ambulate hourly while awake    Complete by:  As directed      Call MD for:  difficulty breathing, headache or visual disturbances    Complete by:  As directed      Call MD for:  persistant dizziness or light-headedness    Complete by:  As directed      Call MD for:  persistant nausea and vomiting    Complete by:  As directed      Call MD for:  redness, tenderness, or signs of infection (pain, swelling, redness, odor or green/yellow discharge around incision site)    Complete by:  As directed      Call MD for:  severe uncontrolled pain    Complete by:  As directed      Call MD for:  temperature >101 F    Complete by:  As directed      Diet bariatric full liquid    Complete by:  As directed      Discharge instructions    Complete by:  As directed   See bariatric discharge instructions     Incentive spirometry    Complete by:  As directed   Perform hourly while awake            Medication List  STOP taking these medications        ibuprofen 200 MG tablet  Commonly known as:  ADVIL,MOTRIN      TAKE these medications        enoxaparin 40 MG/0.4ML injection  Commonly known as:  LOVENOX  Inject 0.4 mLs (40 mg total) into the skin daily.     FLUoxetine 40 MG capsule  Commonly known as:  PROZAC  Take 40 mg by mouth every morning.     ondansetron 4 MG disintegrating tablet  Commonly known as:  ZOFRAN ODT  Take 1 tablet (4 mg total) by mouth every 8 (eight) hours as needed for nausea or vomiting.     oxyCODONE 5 MG/5ML solution  Commonly known as:  ROXICODONE  Take 5-10 mLs (5-10 mg total)  by mouth every 4 (four) hours as needed for moderate pain or severe pain.     vitamin B-12 100 MCG tablet  Commonly known as:  CYANOCOBALAMIN  Take 100 mcg by mouth daily.           Follow-up Information    Follow up with Dorothy Ina, MD. Go on 09/04/2014.   Specialty:  General Surgery   Why:  For Post-Op Check at 11:15 AM   Contact information:   [address] [place] [address] [phone]        The results of significant diagnostics from this hospitalization (including imaging, microbiology, ancillary and laboratory) are listed below for reference.    Significant Diagnostic Studies: Dg Ugi W/water Sol Cm  08/26/2014   CLINICAL DATA:  Postop gastric sleeve procedure.  EXAM: WATER SOLUBLE UPPER GI SERIES  TECHNIQUE: Single-column upper GI series was performed using water soluble contrast.  CONTRAST:  63mL OMNIPAQUE IOHEXOL 300 MG/ML  SOLN  COMPARISON:  Preop study 02/06/2014  FLUOROSCOPY TIME:  Radiation Exposure Index (as provided by the fluoroscopic device):  If the device does not provide the exposure index:  Fluoroscopy Time (in minutes and seconds):  1 minutes and 53 seconds  Number of Acquired Images:  FINDINGS: The distal esophagus is unremarkable. No complicating features from the gastric sleeve procedure. No leaking contrast material. Normal appearance of the stomach and drainage into the duodenum.  IMPRESSION: Unremarkable postop gastric sleeve upper GI.   Electronically Signed   By: Rudie Meyer M.D.   On: 08/26/2014 10:24    Labs: Basic Metabolic Panel:  Recent Labs Lab 08/26/14 0435  NA 138  K 4.6  CL 105  CO2 25  GLUCOSE 138*  BUN 7  CREATININE 0.62  CALCIUM 9.1   Liver Function Tests:  Recent Labs Lab 08/26/14 0435  AST 26  ALT 26  ALKPHOS 72  BILITOT 0.7  PROT 6.8  ALBUMIN 3.6    CBC:  Recent Labs Lab 08/25/14 1615 08/26/14 0435 08/26/14 1645 08/27/14 0454  WBC  --  13.8*  --  9.9  NEUTROABS  --  11.8*  --  6.9  HGB  14.2 14.1 13.6 13.3  HCT 43.2 43.2 42.9 41.3  MCV  --  96.0  --  97.4  PLT  --  332  --  307    CBG: No results for input(s): GLUCAP in the last 168 hours.  Principal Problem:   Morbid obesity with BMI of 60.0-69.9, adult Active Problems:   S/P laparoscopic sleeve gastrectomy   Dyslipidemia   Elevated blood pressure   Mild obstructive sleep apnea   Time coordinating discharge: 15 minutes  Signed:  [name]  Andrey Campanile, MD [facility], [place] [phone] 08/29/2014, 9:10 AM

## 2014-09-01 ENCOUNTER — Telehealth (HOSPITAL_COMMUNITY): Payer: Self-pay

## 2014-09-01 NOTE — Telephone Encounter (Signed)
Attempted DC call, no answer, left message to return call  Made discharge phone call to patient per DROP protocol. Asking the following questions.    1. Do you have someone to care for you now that you are home?   2. Are you having pain now that is not relieved by your pain medication?   3. Are you able to drink the recommended daily amount of fluids (48 ounces minimum/day) and protein (60-80 grams/day) as prescribed by the dietitian or nutritional counselor?   4. Are you taking the vitamins and minerals as prescribed?   5. Do you have the "on call" number to contact your surgeon if you have a problem or question?   6. Are your incisions free of redness, swelling or drainage? (If steri strips, address that these can fall off, shower as tolerated)  7. Have your bowels moved since your surgery?  If not, are you passing gas?   8. Are you up and walking 3-4 times per day?      1. Do you have an appointment made to see your surgeon in the next month?   2. Were you provided your discharge medications before your surgery or before you were discharged from the hospital and are you taking them without problem?   3. Were you provided phone numbers to the clinic/surgeon's office?   4. Did you watch the patient education video module in the (clinic, surgeon's office, etc.) before your surgery?  5. Do you have a discharge checklist that was provided to you in the hospital to reference with instructions on how to take care of yourself after surgery?   6. Did you see a dietitian or nutritional counselor while you were in the hospital?   7. Do you have an appointment to see a dietitian or nutritional counselor in the next month?

## 2014-09-09 ENCOUNTER — Ambulatory Visit: Payer: Self-pay

## 2015-05-18 ENCOUNTER — Encounter: Payer: Self-pay | Admitting: Osteopathic Medicine

## 2015-05-18 ENCOUNTER — Ambulatory Visit (INDEPENDENT_AMBULATORY_CARE_PROVIDER_SITE_OTHER): Payer: 59 | Admitting: Osteopathic Medicine

## 2015-05-18 VITALS — BP 129/63 | HR 62 | Ht 63.0 in | Wt 280.0 lb

## 2015-05-18 DIAGNOSIS — F39 Unspecified mood [affective] disorder: Secondary | ICD-10-CM

## 2015-05-18 DIAGNOSIS — E538 Deficiency of other specified B group vitamins: Secondary | ICD-10-CM

## 2015-05-18 DIAGNOSIS — E519 Thiamine deficiency, unspecified: Secondary | ICD-10-CM

## 2015-05-18 DIAGNOSIS — G4700 Insomnia, unspecified: Secondary | ICD-10-CM

## 2015-05-18 DIAGNOSIS — R41840 Attention and concentration deficit: Secondary | ICD-10-CM

## 2015-05-18 DIAGNOSIS — E559 Vitamin D deficiency, unspecified: Secondary | ICD-10-CM

## 2015-05-18 DIAGNOSIS — Z23 Encounter for immunization: Secondary | ICD-10-CM | POA: Diagnosis not present

## 2015-05-18 DIAGNOSIS — F419 Anxiety disorder, unspecified: Secondary | ICD-10-CM | POA: Diagnosis not present

## 2015-05-18 DIAGNOSIS — F330 Major depressive disorder, recurrent, mild: Secondary | ICD-10-CM

## 2015-05-18 DIAGNOSIS — Z9884 Bariatric surgery status: Secondary | ICD-10-CM

## 2015-05-18 MED ORDER — BUPROPION HCL ER (XL) 150 MG PO TB24: 150.0000 mg | ORAL_TABLET | ORAL | Status: DC

## 2015-05-18 NOTE — Progress Notes (Signed)
HPI: Dorothy Marquez is a 30 y.o. female who [facility] [place] for chief complaint of:  Chief Complaint  Patient presents with  . Establish Care    depression, anxiety/stress    Previously on Lexapro, now on Prozac, hasn't taken it in 3 weeks because ran out.   Panic attack few months ago.   Generalized Anxiety Evaluation:  Excessive anxiety/worry 6 mos+: Yes  Home and work/school: Yes  Difficulty to control: Yes  Associated symptoms (3/adult, 1/child):     Restlessness/on edge No      Fatigue Yes      Concentration Yes      Irritable Yes      Muscle tension Yes      Sleep disturbance Yes  Imparled social/occupational functioning: Yes  Any secondary cause or other concerns?   Major depression Concern  Personality d/o No concern  Social anxiety d/o No concern  Obsessive Compulsive d/o No concern  PTSD No concern  Eating d/o or Body Dysmorphic d/o No concern   Major Depression Evaluation: One of the following for 2wk, most of the day on most days?  Depression: Yes   Anhedonia/reduced interest: Yes  Four of the following, most of the day on most days?  Weight change unintentional: No   Sleep pattern change: Yes   Psychomotor symptom: No   Fatigue: Yes   Worthlessness/guilt: Yes   Concentration problems: Yes   Thoughts of death/suicide: No  Concern for other causes?  Drug-induced problems: No   Bipolar disorder: Some concern   Delusional: No   Hallucinations: No   Bereavement/grief: No   Hypothyroid: No   Alcohol: No   Counselor - was seeing 10/2014, not very helpful  Patient reports some symptoms concerning for possible mania, mood disorder questionnaire was positive.   Past medical, social and family history reviewed: Past Medical History  Diagnosis Date  . Depression   . Anxiety   . History of kidney stones     x5 episodes  . History of hiatal hernia    Past Surgical History  Procedure Laterality Date  .  Wisdom tooth extraction    . Laparoscopic gastric sleeve resection with hiatal hernia repair N/A 08/25/2014    Procedure: LAPAROSCOPIC GASTRIC SLEEVE RESECTION WITH HIATAL HERNIA REPAIR;  Surgeon: Gaynelle AduEric Wilson, MD;  Location: [facility];  Service: General;  Laterality: N/A;  . Upper gi endoscopy N/A 08/25/2014    Procedure: UPPER GI ENDOSCOPY;  Surgeon: Gaynelle AduEric Wilson, MD;  Location: [facility];  Service: General;  Laterality: N/A;   Social History  Substance Use Topics  . Smoking status: Never Smoker   . Smokeless tobacco: Not on file  . Alcohol Use: Yes     Comment: rare- social   Family History  Problem Relation Age of Onset  . Depression Mother   . Diabetes Mother   . Depression Brother   . Hyperlipidemia Brother   . Diabetes Maternal Aunt   . Depression Maternal Aunt   . Alcohol abuse Maternal Uncle     Current Outpatient Prescriptions  Medication Sig Dispense Refill  . FLUoxetine (PROZAC) 40 MG capsule Take 60 mg by mouth daily. Patient reported she takes 60 mg daily     No current facility-administered medications for this visit.   No Known Allergies    Review of Systems: CONSTITUTIONAL:  No  fever, no chills, No  unintentional weight changes HEAD/EYES/EARS/NOSE/THROAT: No  headache, no vision change, no hearing change, No  sore  throat, No  sinus pressure CARDIAC: No  chest pain, No  pressure, No palpitations, No  orthopnea RESPIRATORY: No  cough, No  shortness of breath/wheeze GASTROINTESTINAL: No  nausea, No  vomiting, No  abdominal pain, No  blood in stool, No  diarrhea, No  constipation  MUSCULOSKELETAL: No  myalgia/arthralgia GENITOURINARY: No  incontinence, No  abnormal genital bleeding/discharge SKIN: No  rash/wounds/concerning lesions HEM/ONC: No  easy bruising/bleeding, No  abnormal lymph node ENDOCRINE: No polyuria/polydipsia/polyphagia, No  heat/cold intolerance  NEUROLOGIC: No  weakness, No  dizziness, No  slurred speech PSYCHIATRIC: (+) concerns with depression, (+)  concerns with anxiety, (+) sleep problems  Exam:  BP 129/63 mmHg  Pulse 62  Ht  (1.6 m)  Wt 280 lb (127.007 kg)  BMI 49.61 kg/m2 Constitutional: VS see above. General Appearance: alert, well-developed, well-nourished, NAD Eyes: Normal lids and conjunctive, non-icteric sclera,  Ears, Nose, Mouth, Throat: MMM, Normal external inspection ears/nares/mouth/lips/gums, Neck: No masses, trachea midline. No thyroid enlargement/tenderness/mass appreciated. No lymphadenopathy Respiratory: Normal respiratory effort. no wheeze, no rhonchi, no rales Cardiovascular: S1/S2 normal, no murmur, no rub/gallop auscultated. RRR. No lower extremity edema. Psychiatric: Normal judgment/insight. Normal mood and affect. Oriented x3.    No results found for this or any previous visit (from the past 72 hour(s)).    ASSESSMENT/PLAN: Referral to psychiatry for further evaluation bipolar versus attention deficit in addition to major depressive disorder and generalized anxiety disorder. Switch from Loxitane, which patient had not been taking for some weeks anyway, starts Wellbutrin, hopefully will help with attention issues and weights. Patient has been educated on significant possible side effects of medication and is instructed to contact me or other medical professional with any concerns about side effects. RTC/ER precautions reviewed, to call 911 or present to emergency room if thoughts of hurting self or others. Let me know if any intolerable side effects or if symptoms are getting worse. Labs as below partially to rule out secondary cause of depression/anxiety, patient also due for follow-up labs status post gastric bypass.     Mild episode of recurrent major depressive disorder (HCC) - Plan: buPROPion (WELLBUTRIN XL) 150 MG 24 hr tablet  Anxiety disorder, unspecified anxiety disorder type - Plan: buPROPion (WELLBUTRIN XL) 150 MG 24 hr tablet, Thyroid Panel With TSH  Mood disorder (HCC) - Screened (+) on Mood  D/O questionnaire, concern for possible Bipolar - psych refer for definitive diagnosis  Inattention - ADD vs MDD/GAD effects   Need for prophylactic vaccination and inoculation against influenza - Plan: Flu Vaccine QUAD 36+ mos IM  History of gastric bypass - Plan: CBC with Differential/Platelet, CMP and Liver, Ferritin, Folate, Iron and TIBC, Lipid panel, Parathyroid hormone, intact (no Ca), VITAMIN D 25 Hydroxy (Vit-D Deficiency, Fractures), Vitamin B1, Vitamin B12, Zinc, Ceruloplasmin     Return in about 4 weeks (around 06/15/2015), or sooner if needed, for FOLLOW UP ON MEDICATION MANAGEMENT.

## 2015-05-18 NOTE — Patient Instructions (Addendum)
Call or message me in 2 weeks to let me know how you're doing on the medicines - at that point if you're tolerating the meds but not getting full relief of symptoms, we can consider increasing the dose. Let me know sooner if you're having severe side effects or if your symptoms are getting worse. Call 911/ go to ER if you have any thoughts of hurting yourself or others.   We will work on getting you a referral to psychiatry / behavioral health for further evaluation for Bipolar and/or attention deficit problems. Please let us know if you don't hear back about setting up an appointment.   Please get fasting labs done in the next few days to a week.   Think about your birth control plan! Use a backup method (condoms/abstinence) while you are on medications.   Take care! Let us know if you have any other questions or concerns! -Dr. Mervyn SkeetersA.

## 2015-05-20 LAB — CMP AND LIVER
ALBUMIN: 3.7 g/dL (ref 3.6–5.1)
ALK PHOS: 82 U/L (ref 33–115)
ALT: 11 U/L (ref 6–29)
AST: 17 U/L (ref 10–30)
BILIRUBIN DIRECT: 0.2 mg/dL (ref <=0.2)
BILIRUBIN INDIRECT: 0.8 mg/dL (ref 0.2–1.2)
BILIRUBIN TOTAL: 1 mg/dL (ref 0.2–1.2)
BUN: 8 mg/dL (ref 7–25)
CALCIUM: 9.5 mg/dL (ref 8.6–10.2)
CO2: 26 mmol/L (ref 20–31)
Chloride: 105 mmol/L (ref 98–110)
Creat: 0.61 mg/dL (ref 0.50–1.10)
Glucose, Bld: 74 mg/dL (ref 65–99)
Potassium: 3.9 mmol/L (ref 3.5–5.3)
Sodium: 140 mmol/L (ref 135–146)
TOTAL PROTEIN: 6.5 g/dL (ref 6.1–8.1)

## 2015-05-20 LAB — LIPID PANEL
CHOL/HDL RATIO: 4.3 ratio (ref <=5.0)
Cholesterol: 187 mg/dL (ref 125–200)
HDL: 43 mg/dL — ABNORMAL LOW (ref >=46)
LDL Cholesterol: 130 mg/dL — ABNORMAL HIGH (ref <130)
Triglycerides: 71 mg/dL (ref <150)
VLDL: 14 mg/dL (ref <30)

## 2015-05-20 LAB — CBC WITH DIFFERENTIAL/PLATELET
Basophils Absolute: 0 10*3/uL (ref 0.0–0.1)
Basophils Relative: 0 % (ref 0–1)
Eosinophils Absolute: 0.1 10*3/uL (ref 0.0–0.7)
Eosinophils Relative: 1 % (ref 0–5)
HEMATOCRIT: 42.8 % (ref 36.0–46.0)
HEMOGLOBIN: 14.1 g/dL (ref 12.0–15.0)
LYMPHS ABS: 1.3 10*3/uL (ref 0.7–4.0)
Lymphocytes Relative: 20 % (ref 12–46)
MCH: 32.1 pg (ref 26.0–34.0)
MCHC: 32.9 g/dL (ref 30.0–36.0)
MCV: 97.5 fL (ref 78.0–100.0)
MPV: 11.3 fL (ref 8.6–12.4)
Monocytes Absolute: 0.7 10*3/uL (ref 0.1–1.0)
Monocytes Relative: 11 % (ref 3–12)
Neutro Abs: 4.6 10*3/uL (ref 1.7–7.7)
Neutrophils Relative %: 68 % (ref 43–77)
Platelets: 326 10*3/uL (ref 150–400)
RBC: 4.39 MIL/uL (ref 3.87–5.11)
RDW: 12.8 % (ref 11.5–15.5)
WBC: 6.7 10*3/uL (ref 4.0–10.5)

## 2015-05-20 LAB — HEPATIC FUNCTION PANEL
ALBUMIN: 3.7 g/dL (ref 3.6–5.1)
ALT: 11 U/L (ref 6–29)
AST: 17 U/L (ref 10–30)
Alkaline Phosphatase: 82 U/L (ref 33–115)
BILIRUBIN TOTAL: 1 mg/dL (ref 0.2–1.2)
Bilirubin, Direct: 0.2 mg/dL (ref <=0.2)
Indirect Bilirubin: 0.8 mg/dL (ref 0.2–1.2)
Total Protein: 6.5 g/dL (ref 6.1–8.1)

## 2015-05-20 LAB — IRON AND TIBC
%SAT: 53 % — ABNORMAL HIGH (ref 11–50)
IRON: 124 ug/dL (ref 40–190)
TIBC: 234 ug/dL — ABNORMAL LOW (ref 250–450)
UIBC: 110 ug/dL — ABNORMAL LOW (ref 125–400)

## 2015-05-20 LAB — THYROID PANEL WITH TSH
Free Thyroxine Index: 2.7 (ref 1.4–3.8)
T3 Uptake: 25 % (ref 22–35)
T4 TOTAL: 10.8 ug/dL (ref 4.5–12.0)
TSH: 2.69 m[IU]/L

## 2015-05-20 LAB — FERRITIN: FERRITIN: 257 ng/mL — AB (ref 10–154)

## 2015-05-20 LAB — FOLATE: FOLATE: 2 ng/mL — AB (ref >5.4)

## 2015-05-21 LAB — VITAMIN D 25 HYDROXY (VIT D DEFICIENCY, FRACTURES): VIT D 25 HYDROXY: 8 ng/mL — AB (ref 30–100)

## 2015-05-21 LAB — PARATHYROID HORMONE, INTACT (NO CA): PTH: 105 pg/mL — ABNORMAL HIGH (ref 14–64)

## 2015-05-22 LAB — ZINC: Zinc: 61 ug/dL (ref 60–130)

## 2015-05-22 LAB — CERULOPLASMIN: Ceruloplasmin: 24 mg/dL (ref 18–53)

## 2015-05-25 DIAGNOSIS — E538 Deficiency of other specified B group vitamins: Secondary | ICD-10-CM | POA: Insufficient documentation

## 2015-05-25 DIAGNOSIS — R41840 Attention and concentration deficit: Secondary | ICD-10-CM | POA: Insufficient documentation

## 2015-05-25 DIAGNOSIS — Z9884 Bariatric surgery status: Secondary | ICD-10-CM

## 2015-05-25 DIAGNOSIS — F411 Generalized anxiety disorder: Secondary | ICD-10-CM | POA: Insufficient documentation

## 2015-05-25 DIAGNOSIS — E559 Vitamin D deficiency, unspecified: Secondary | ICD-10-CM | POA: Insufficient documentation

## 2015-05-25 DIAGNOSIS — F330 Major depressive disorder, recurrent, mild: Secondary | ICD-10-CM | POA: Insufficient documentation

## 2015-05-25 DIAGNOSIS — E519 Thiamine deficiency, unspecified: Secondary | ICD-10-CM | POA: Insufficient documentation

## 2015-05-25 DIAGNOSIS — Z23 Encounter for immunization: Secondary | ICD-10-CM | POA: Insufficient documentation

## 2015-05-25 DIAGNOSIS — F39 Unspecified mood [affective] disorder: Secondary | ICD-10-CM | POA: Insufficient documentation

## 2015-05-25 HISTORY — DX: Bariatric surgery status: Z98.84

## 2015-05-25 LAB — VITAMIN B1: Vitamin B1 (Thiamine): <7 nmol/L — ABNORMAL LOW (ref 8–30)

## 2015-05-25 MED ORDER — THIAMINE HCL 50 MG PO TABS: 25.0000 mg | ORAL_TABLET | Freq: Every day | ORAL | Status: DC

## 2015-05-25 MED ORDER — FOLIC ACID 1 MG PO TABS: 1.0000 mg | ORAL_TABLET | Freq: Every day | ORAL | Status: DC

## 2015-05-25 MED ORDER — VITAMIN D (ERGOCALCIFEROL) 1.25 MG (50000 UNIT) PO CAPS: 50000.0000 [IU] | ORAL_CAPSULE | ORAL | Status: DC

## 2015-05-25 NOTE — Addendum Note (Signed)
Addended by: Deirdre PippinsALEXANDER, Satine Hausner M on: 05/25/2015 09:45 AM   Modules accepted: Orders

## 2015-06-15 ENCOUNTER — Ambulatory Visit (INDEPENDENT_AMBULATORY_CARE_PROVIDER_SITE_OTHER): Payer: 59 | Admitting: Osteopathic Medicine

## 2015-06-15 ENCOUNTER — Encounter: Payer: Self-pay | Admitting: Osteopathic Medicine

## 2015-06-15 VITALS — BP 129/83 | HR 86 | Ht 63.0 in | Wt 263.0 lb

## 2015-06-15 DIAGNOSIS — Z9884 Bariatric surgery status: Secondary | ICD-10-CM

## 2015-06-15 DIAGNOSIS — F39 Unspecified mood [affective] disorder: Secondary | ICD-10-CM | POA: Diagnosis not present

## 2015-06-15 DIAGNOSIS — E559 Vitamin D deficiency, unspecified: Secondary | ICD-10-CM | POA: Diagnosis not present

## 2015-06-15 DIAGNOSIS — F419 Anxiety disorder, unspecified: Secondary | ICD-10-CM

## 2015-06-15 DIAGNOSIS — F330 Major depressive disorder, recurrent, mild: Secondary | ICD-10-CM

## 2015-06-15 DIAGNOSIS — E538 Deficiency of other specified B group vitamins: Secondary | ICD-10-CM

## 2015-06-15 DIAGNOSIS — E519 Thiamine deficiency, unspecified: Secondary | ICD-10-CM

## 2015-06-15 DIAGNOSIS — Z9889 Other specified postprocedural states: Secondary | ICD-10-CM | POA: Diagnosis not present

## 2015-06-15 MED ORDER — BUPROPION HCL ER (XL) 300 MG PO TB24: 300.0000 mg | ORAL_TABLET | ORAL | Status: DC

## 2015-06-15 NOTE — Progress Notes (Signed)
HPI: Dorothy Marquez is a 30 y.o. female who presents to [facility] today for chief complaint of:  Chief Complaint  Patient presents with  . Follow-up    ANXIETY AND DEPRESSION   ANX/DEP -   Previously on SSRI therapy however at last visit was screening positive for mood disorder, had not heard back from psychiatry, I'm not sure if this referral went through her if I ordered it improperly, we will initiate this again.   No thoughts of hurting herself or others, however since we started on Wellbutrin at last visit due to concern for weight problems as well as inattention, she states she hasn't suffered any serious side effects of this medicine however hasn't noticed a lot of benefit either.  Counselor - was seeing 10/2014, not very helpful.   Patient reports some symptoms concerning for possible mania, mood disorder questionnaire was positive.  HX GASTRIC BYPASS  Several vitamin deficiencies noted on monitoring labs for bypass patients, patient has been compliant with vitamin supplementation.   Past medical, social and family history reviewed: Past Medical History  Diagnosis Date  . Depression   . Anxiety   . History of kidney stones     x5 episodes  . History of hiatal hernia    Past Surgical History  Procedure Laterality Date  . Wisdom tooth extraction    . Laparoscopic gastric sleeve resection with hiatal hernia repair N/A 08/25/2014    Procedure: LAPAROSCOPIC GASTRIC SLEEVE RESECTION WITH HIATAL HERNIA REPAIR;  Surgeon: Gaynelle Adu, MD;  Location: [facility];  Service: General;  Laterality: N/A;  . Upper gi endoscopy N/A 08/25/2014    Procedure: UPPER GI ENDOSCOPY;  Surgeon: Gaynelle Adu, MD;  Location: [facility];  Service: General;  Laterality: N/A;   Social History  Substance Use Topics  . Smoking status: Never Smoker   . Smokeless tobacco: Not on file  . Alcohol Use: Yes     Comment: rare- social   Family History  Problem Relation Age of  Onset  . Depression Mother   . Diabetes Mother   . Depression Brother   . Hyperlipidemia Brother   . Diabetes Maternal Aunt   . Depression Maternal Aunt   . Alcohol abuse Maternal Uncle     Current Outpatient Prescriptions  Medication Sig Dispense Refill  . buPROPion (WELLBUTRIN XL) 150 MG 24 hr tablet Take 1 tablet (150 mg total) by mouth every morning. 30 tablet 1  . folic acid (FOLVITE) 1 MG tablet Take 1 tablet (1 mg total) by mouth daily. 30 tablet 3  . Melatonin 5 MG CAPS Take by mouth.    . thiamine 50 MG tablet Take 0.5 tablets (25 mg total) by mouth daily. 20 tablet 0  . Vitamin D, Ergocalciferol, (DRISDOL) 50000 units CAPS capsule Take 1 capsule (50,000 Units total) by mouth every 7 (seven) days. Take for 8 total doses(weeks) 4 capsule 3   No current facility-administered medications for this visit.   No Known Allergies    Review of Systems: CONSTITUTIONAL:  No  fever, no chills, No  unintentional weight changes CARDIAC: No  chest pain, RESPIRATORY: No  cough,  GASTROINTESTINAL: No  nausea, No  vomiting, No  abdominal pain,  NEUROLOGIC: No  weakness, No  dizziness, No  slurred speech PSYCHIATRIC: (+) concerns with depression, (+) concerns with anxiety, (+) sleep problems  Exam:  BP 129/83 mmHg  Pulse 86  Ht  (1.6 m)  Wt 263 lb (119.296 kg)  BMI  46.60 kg/m2 Constitutional: VS see above. General Appearance: alert, well-developed, well-nourished, NAD Eyes: Normal lids and conjunctive, non-icteric sclera,  Ears, Nose, Mouth, Throat: MMM, Neck: No masses, trachea midline.  Psychiatric: Normal judgment/insight. Normal mood and affect. Oriented x3.    No results found for this or any previous visit (from the past 72 hour(s)).    ASSESSMENT/PLAN:   Referral to psychiatry for further evaluation bipolar versus attention deficit in addition to major depressive disorder and generalized anxiety disorder. Will increase Wellbutrin, could consider adding Prozac in the  next few weeks, concern for SSRI precipitating mania or other bipolar problems, however she has been on SSRIs in the past and fluoxetine is one that we could easily discontinue if we need to. Patient is instructed to call me in 2 weeks and update on how she is doing.   Continue vitamin supplementation for nutrient deficiencies, will plan to recheck after 8 weeks on vitamins.  RTC/ER precautions reviewed, to call 911 or present to emergency room if thoughts of hurting self or others. Let me know if any intolerable side effects or if symptoms are getting worse.     Anxiety disorder, unspecified anxiety disorder type - Plan: buPROPion (WELLBUTRIN XL) 300 MG 24 hr tablet, Ambulatory referral to Psychiatry  Mood disorder (HCC) - Plan: Ambulatory referral to Psychiatry  History of gastric bypass  Vitamin D deficiency  Folate deficiency  Thiamine deficiency  Mild episode of recurrent major depressive disorder (HCC) - Plan: buPROPion (WELLBUTRIN XL) 300 MG 24 hr tablet    Return in about 4 weeks (around 07/13/2015), or sooner if needed, for LABS AND MEDICATION MANAGEMENT.

## 2015-06-15 NOTE — Patient Instructions (Signed)
Let's plan to follow-up in the office in 4 weeks.  Please call in 2 weeks, if you are doing well on the increased dose of Wellbutrin but still don't feeling desired benefits, we can add Prozac. Please let u sknow if you haven't heard back about setting up an appointment with Psychiatry in the next 2 - 3 business day.   Plan to get blood work done 3 - 4 days prior to you rnext appointment with me so we can go over results at your visit. Continue the vitamins, let u sknow if you need refills.

## 2015-07-21 ENCOUNTER — Ambulatory Visit (INDEPENDENT_AMBULATORY_CARE_PROVIDER_SITE_OTHER): Payer: 59 | Admitting: Osteopathic Medicine

## 2015-07-21 ENCOUNTER — Encounter: Payer: Self-pay | Admitting: Osteopathic Medicine

## 2015-07-21 VITALS — BP 117/80 | HR 84 | Ht 63.0 in | Wt 257.0 lb

## 2015-07-21 DIAGNOSIS — Z9889 Other specified postprocedural states: Secondary | ICD-10-CM | POA: Diagnosis not present

## 2015-07-21 DIAGNOSIS — E559 Vitamin D deficiency, unspecified: Secondary | ICD-10-CM

## 2015-07-21 DIAGNOSIS — F419 Anxiety disorder, unspecified: Secondary | ICD-10-CM | POA: Diagnosis not present

## 2015-07-21 DIAGNOSIS — E519 Thiamine deficiency, unspecified: Secondary | ICD-10-CM

## 2015-07-21 DIAGNOSIS — Z9884 Bariatric surgery status: Secondary | ICD-10-CM

## 2015-07-21 DIAGNOSIS — E538 Deficiency of other specified B group vitamins: Secondary | ICD-10-CM

## 2015-07-21 DIAGNOSIS — F330 Major depressive disorder, recurrent, mild: Secondary | ICD-10-CM

## 2015-07-21 MED ORDER — BUPROPION HCL ER (XL) 450 MG PO TB24: 450.0000 mg | ORAL_TABLET | ORAL | Status: DC

## 2015-07-21 NOTE — Progress Notes (Signed)
HPI: Dorothy Marquez is a 30 y.o. female who presents to [name] today for chief complaint of:  Chief Complaint  Patient presents with  . Follow-up    ANXIETY    Anxiety/mood:  . Quality: Her greatest problem is her with irritability and concentration . Severity: Better on the medication . Context:Patient states that she is doing a little bit better on the Wellbutrin to is not at a place where she feels that goal in terms of her mood, particularly with irritability and concentration problems. She has an upcoming appointment in about 1 month with behavioral health.  . Modifying factors: Currently on Wellbutrin 300 . Assoc signs/symptoms:No thoughts of hurting self or others  Nutrition deficiencies: Patient compliant with supplementation, history of gastric bypass, due for repeat labs.   Past medical, social and family history reviewed: Past Medical History  Diagnosis Date  . Depression   . Anxiety   . History of kidney stones     x5 episodes  . History of hiatal hernia    Past Surgical History  Procedure Laterality Date  . Wisdom tooth extraction    . Laparoscopic gastric sleeve resection with hiatal hernia repair N/A 08/25/2014    Procedure: LAPAROSCOPIC GASTRIC SLEEVE RESECTION WITH HIATAL HERNIA REPAIR;  Surgeon: Gaynelle Adu, MD;  Location: [facility];  Service: General;  Laterality: N/A;  . Upper gi endoscopy N/A 08/25/2014    Procedure: UPPER GI ENDOSCOPY;  Surgeon: Gaynelle Adu, MD;  Location: [facility];  Service: General;  Laterality: N/A;   Social History  Substance Use Topics  . Smoking status: Never Smoker   . Smokeless tobacco: Not on file  . Alcohol Use: Yes     Comment: rare- social   Family History  Problem Relation Age of Onset  . Depression Mother   . Diabetes Mother   . Depression Brother   . Hyperlipidemia Brother   . Diabetes Maternal Aunt   . Depression Maternal Aunt   . Alcohol abuse Maternal Uncle     Current  Outpatient Prescriptions  Medication Sig Dispense Refill  . buPROPion (WELLBUTRIN XL) 300 MG 24 hr tablet Take 1 tablet (300 mg total) by mouth every morning. 30 tablet 1  . folic acid (FOLVITE) 1 MG tablet Take 1 tablet (1 mg total) by mouth daily. 30 tablet 3  . Melatonin 5 MG CAPS Take by mouth.    . thiamine 50 MG tablet Take 0.5 tablets (25 mg total) by mouth daily. 20 tablet 0  . Vitamin D, Ergocalciferol, (DRISDOL) 50000 units CAPS capsule Take 1 capsule (50,000 Units total) by mouth every 7 (seven) days. Take for 8 total doses(weeks) 4 capsule 3   No current facility-administered medications for this visit.   No Known Allergies    Review of Systems: CARDIAC: No  chest pain PSYCHIATRIC: No  concerns with depression, No  concerns with anxiety, No sleep problems  Exam:  BP 117/80 mmHg  Pulse 84  Ht  (1.6 m)  Wt 257 lb (116.574 kg)  BMI 45.54 kg/m2 Constitutional: VS see above. General Appearance: alert, well-developed, well-nourished, NAD Psychiatric: Normal judgment/insight. Normal mood and affect. Oriented x3.    No results found for this or any previous visit (from the past 72 hour(s)).    ASSESSMENT/PLAN:  Mild episode of recurrent major depressive disorder (HCC) - Plan: buPROPion 450 MG TB24  Anxiety disorder, unspecified anxiety disorder type - Plan: buPROPion 450 MG TB24  History of gastric bypass  Vitamin  D deficiency - Plan: Vitamin D (25 hydroxy)  Folate deficiency - Plan: Folate  Thiamine deficiency - Plan: Vitamin B1   All questions were answered. Visit summary with updated medication list and pertinent instructions was printed for patient. ER/RTC precautions were reviewed with the patient. Return if symptoms worsen or fail to improve prior to psychiatry appt otherwise return based on lab results.

## 2015-07-22 LAB — FOLATE: Folate: 11.9 ng/mL (ref >5.4)

## 2015-07-22 LAB — VITAMIN D 25 HYDROXY (VIT D DEFICIENCY, FRACTURES): VIT D 25 HYDROXY: 27 ng/mL — AB (ref 30–100)

## 2015-07-26 LAB — VITAMIN B1

## 2015-08-18 ENCOUNTER — Ambulatory Visit (HOSPITAL_COMMUNITY): Payer: Self-pay | Admitting: Psychiatry

## 2015-08-27 ENCOUNTER — Encounter (INDEPENDENT_AMBULATORY_CARE_PROVIDER_SITE_OTHER): Payer: Self-pay

## 2015-08-27 ENCOUNTER — Encounter (HOSPITAL_COMMUNITY): Payer: Self-pay | Admitting: Psychiatry

## 2015-08-27 ENCOUNTER — Ambulatory Visit (INDEPENDENT_AMBULATORY_CARE_PROVIDER_SITE_OTHER): Payer: 59 | Admitting: Psychiatry

## 2015-08-27 VITALS — BP 122/76 | HR 88 | Ht 63.0 in | Wt 255.0 lb

## 2015-08-27 DIAGNOSIS — Z8659 Personal history of other mental and behavioral disorders: Secondary | ICD-10-CM | POA: Diagnosis not present

## 2015-08-27 DIAGNOSIS — F330 Major depressive disorder, recurrent, mild: Secondary | ICD-10-CM

## 2015-08-27 DIAGNOSIS — F0630 Mood disorder due to known physiological condition, unspecified: Secondary | ICD-10-CM | POA: Diagnosis not present

## 2015-08-27 DIAGNOSIS — F411 Generalized anxiety disorder: Secondary | ICD-10-CM

## 2015-08-27 DIAGNOSIS — F4310 Post-traumatic stress disorder, unspecified: Secondary | ICD-10-CM

## 2015-08-27 MED ORDER — FLUOXETINE HCL 20 MG PO TABS: 20.0000 mg | ORAL_TABLET | Freq: Every day | ORAL | Status: DC

## 2015-08-27 MED ORDER — BUPROPION HCL ER (XL) 150 MG PO TB24: 300.0000 mg | ORAL_TABLET | Freq: Every day | ORAL | Status: DC
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
## 2015-09-24 ENCOUNTER — Ambulatory Visit (HOSPITAL_COMMUNITY): Payer: Self-pay | Admitting: Psychiatry

## 2015-09-25 ENCOUNTER — Telehealth (HOSPITAL_COMMUNITY): Payer: Self-pay | Admitting: Psychiatry

## 2015-09-28 NOTE — Telephone Encounter (Signed)
Fax received from [facility] requesting refills for Prozac and Bupropion. Per Dr. Gilmore Laroche, refill requests are authorized for Prozac 20mg , #30 w/ no refills and Bupropion 150mg , #60 w/ no refills. Rx were escribed to pharmacy. Pt is schedule for a f/u appt on 10/15/15. Called and informed pt of refill status. Pt verbalizes understanding.

## 2015-09-29 ENCOUNTER — Encounter (HOSPITAL_COMMUNITY): Payer: Self-pay | Admitting: Licensed Clinical Social Worker

## 2015-10-01 ENCOUNTER — Ambulatory Visit (HOSPITAL_COMMUNITY): Payer: Self-pay | Admitting: Licensed Clinical Social Worker

## 2015-10-07 ENCOUNTER — Ambulatory Visit (INDEPENDENT_AMBULATORY_CARE_PROVIDER_SITE_OTHER): Payer: 59 | Admitting: Osteopathic Medicine

## 2015-10-07 ENCOUNTER — Encounter: Payer: Self-pay | Admitting: Osteopathic Medicine

## 2015-10-07 VITALS — BP 119/79 | HR 80 | Ht 62.0 in | Wt 258.0 lb

## 2015-10-07 DIAGNOSIS — G44219 Episodic tension-type headache, not intractable: Secondary | ICD-10-CM | POA: Diagnosis not present

## 2015-10-07 MED ORDER — KETOROLAC TROMETHAMINE 60 MG/2ML IM SOLN
60.0000 mg | Freq: Once | INTRAMUSCULAR | Status: AC
  Administered 2015-10-07: 60 mg via INTRAMUSCULAR

## 2015-10-07 NOTE — Progress Notes (Signed)
HPI: Dorothy Marquez is a 30 y.o. Not Hispanic or Latino female  who presents to [facility] today, 10/07/15,  for chief complaint of:  Chief Complaint  Patient presents with  . Headache    Persistent headache . Location: frontal/forehead . Quality: pressure, throbbing . Duration: headaches on and off for about 2 months, no previous headaches, last a few days in the beginngin, now they last about three days, up to twice per week.  . Timing: worse around nighttime  . Modifying factors: has tried Excedrin Migraine, BC Powder, Tylenol - almost daily . Assoc signs/symptoms: no vision changes, occasional with nausea, no aura symptoms, no vision correction. No new medications.   .   Past medical, surgical, social and family history reviewed: Past Medical History:  Diagnosis Date  . Anxiety   . Depression   . History of hiatal hernia   . History of kidney stones    x5 episodes   Past Surgical History:  Procedure Laterality Date  . LAPAROSCOPIC GASTRIC SLEEVE RESECTION WITH HIATAL HERNIA REPAIR N/A 08/25/2014   Procedure: LAPAROSCOPIC GASTRIC SLEEVE RESECTION WITH HIATAL HERNIA REPAIR;  Surgeon: Gaynelle Adu, MD;  Location: [facility];  Service: General;  Laterality: N/A;  . UPPER GI ENDOSCOPY N/A 08/25/2014   Procedure: UPPER GI ENDOSCOPY;  Surgeon: Gaynelle Adu, MD;  Location: [facility];  Service: General;  Laterality: N/A;  . WISDOM TOOTH EXTRACTION     Social History  Substance Use Topics  . Smoking status: Never Smoker  . Smokeless tobacco: Never Used  . Alcohol use Yes     Comment: rare- social   Family History  Problem Relation Age of Onset  . Depression Mother   . Diabetes Mother   . Depression Brother   . Hyperlipidemia Brother   . Diabetes Maternal Aunt   . Depression Maternal Aunt   . Alcohol abuse Maternal Uncle      Current medication list and allergy/intolerance information reviewed:   Current Outpatient Prescriptions  Medication Sig  Dispense Refill  . buPROPion (WELLBUTRIN XL) 150 MG 24 hr tablet TAKE 2 TABLETS(300 MG) BY MOUTH DAILY 60 tablet 0  . FLUoxetine (PROZAC) 20 MG tablet TAKE 1 TABLET(20 MG) BY MOUTH DAILY 30 tablet 0  . folic acid (FOLVITE) 1 MG tablet Take 1 tablet (1 mg total) by mouth daily. 30 tablet 3  . Melatonin 5 MG CAPS Take by mouth.    . thiamine 50 MG tablet Take 0.5 tablets (25 mg total) by mouth daily. 20 tablet 0  . Vitamin D, Ergocalciferol, (DRISDOL) 50000 units CAPS capsule Take 1 capsule (50,000 Units total) by mouth every 7 (seven) days. Take for 8 total doses(weeks) 4 capsule 3   No current facility-administered medications for this visit.    No Known Allergies    Review of Systems:  Constitutional:  No  fever, no chills, No recent illness, No unintentional weight changes. No significant fatigue.   HEENT: (+)headache, no vision change, no hearing change, No sore throat, No  sinus pressure  Cardiac: No  chest pain, No  pressure  Respiratory:  No  shortness of breath. No  Cough  Gastrointestinal: No  abdominal pain, Occasional nausea  Musculoskeletal: No new myalgia/arthralgia  Skin: No  Rash  Neurologic: No  weakness, No  dizziness, No  slurred speech   Exam:  BP 119/79   Pulse 80   Ht 5\' 2"  (1.575 m)   Wt 258 lb (117 kg)   BMI 47.19  kg/m   Constitutional: VS see above. General Appearance: alert, well-developed, well-nourished, NAD  Eyes: Normal lids and conjunctive, non-icteric sclera, limited nondilated funduscopic exam appears normal  Ears, Nose, Mouth, Throat: MMM, Normal external inspection ears/nares/mouth/lips/gums. TM normal bilaterally. Pharynx/tonsils no erythema, no exudate. Nasal mucosa normal.   Neck: No masses, trachea midline. No thyroid enlargement. No tenderness/mass appreciated. No lymphadenopathy  Respiratory: Normal respiratory effort. no wheeze, no rhonchi, no rales  Cardiovascular: S1/S2 normal, no murmur, no rub/gallop auscultated. RRR.    Neurological: No cranial nerve deficit on limited exam. Motor and sensation intact and symmetric. Cerebellar reflexes intact. Normal balance/coordination. No tremor. EOMI, PERRLA   Skin: warm, dry, intact.    Psychiatric: Normal judgment/insight. Normal mood and affect. Oriented x3.      ASSESSMENT/PLAN:   Ongoing headache with almost daily NSAIDs and acetaminophen use, consider rebound headache syndrome. Shot of Toradol in the office today, patient is encouraged to with hold over-the-counter analgesics for the next few days/through the weekend and see if headache will subside on its own, with the understanding that headache may persist for the first few days but if anything gets worse or changes or if she is concerned she should seek medical care ASAP.   If headache persists past this weekend and next week despite discontinuation of over-the-counter analgesic medication, could consider daily prophylactic therapy for tension headache, and if that doesn't improve things, consider imaging/neurology referral.   CT results from ER show normal, consider MRI for further evaluation if symptoms persist or change/worsen  Episodic tension-type headache, not intractable - Plan: ketorolac (TORADOL) injection 60 mg     Visit summary with medication list and pertinent instructions was printed for patient to review. All questions at time of visit were answered - patient instructed to contact office with any additional concerns. ER/RTC precautions were reviewed with the patient. Follow-up plan: Return if symptoms worsen or fail to improve.

## 2015-10-07 NOTE — Patient Instructions (Addendum)
Will plan Toradol shot in the office today, hopefully this will help. I suspect that overuse of over-the-counter pain medications is contributing to a rebound headache effect. After Toradol shot today, would discontinue use of these medicines through the weekend. He if headache subsides on its own without medication. If it does, and if you are not having any other problems, then we found our answer. If you're continuing to have headaches through the weekend into next week, or if anything gets worse or changed in the meantime, please let me know. We may need to consider starting a daily medication for prevention of tension-type headaches. Would also recommend formal evaluation with optometrist or ophthalmologist for vision exam to see if corrective lenses may be an issue that could help you.

## 2015-10-08 DIAGNOSIS — G44219 Episodic tension-type headache, not intractable: Secondary | ICD-10-CM | POA: Insufficient documentation

## 2015-10-12 ENCOUNTER — Encounter: Payer: Self-pay | Admitting: Osteopathic Medicine

## 2015-10-12 ENCOUNTER — Other Ambulatory Visit (HOSPITAL_COMMUNITY)
Admission: RE | Admit: 2015-10-12 | Discharge: 2015-10-12 | Disposition: A | Discharge status: Home / Self Care | Payer: 59 | Source: Ambulatory Visit | Attending: Osteopathic Medicine | Admitting: Osteopathic Medicine

## 2015-10-12 ENCOUNTER — Ambulatory Visit (INDEPENDENT_AMBULATORY_CARE_PROVIDER_SITE_OTHER): Payer: 59 | Admitting: Osteopathic Medicine

## 2015-10-12 VITALS — BP 116/80 | HR 78 | Ht 63.0 in | Wt 256.0 lb

## 2015-10-12 DIAGNOSIS — Z01419 Encounter for gynecological examination (general) (routine) without abnormal findings: Secondary | ICD-10-CM | POA: Insufficient documentation

## 2015-10-12 DIAGNOSIS — N760 Acute vaginitis: Secondary | ICD-10-CM | POA: Insufficient documentation

## 2015-10-12 DIAGNOSIS — Z124 Encounter for screening for malignant neoplasm of cervix: Secondary | ICD-10-CM

## 2015-10-12 DIAGNOSIS — Z30432 Encounter for removal of intrauterine contraceptive device: Secondary | ICD-10-CM | POA: Diagnosis not present

## 2015-10-12 DIAGNOSIS — Z113 Encounter for screening for infections with a predominantly sexual mode of transmission: Secondary | ICD-10-CM

## 2015-10-12 DIAGNOSIS — Z308 Encounter for other contraceptive management: Secondary | ICD-10-CM | POA: Diagnosis not present

## 2015-10-12 NOTE — Progress Notes (Signed)
HPI: Dorothy Marquez is a 30 y.o. female  who presents to [name] today, 10/12/15,  for chief complaint of:  Chief Complaint  Patient presents with  . Contraception     MIRANA REMOVAL     Patient here for IUD removal, Devonne Doughty has been in place she thinks maybe 7-8 years. Patient does not need/desire alternative contraception at this time. Patient is not interested in another IUD at this time.  Patient not sure last Pap smear, thinks probably more than 3 years ago.  Past medical, surgical, social and family history reviewed: Past Medical History:  Diagnosis Date  . Anxiety   . Depression   . History of hiatal hernia   . History of kidney stones    x5 episodes   Past Surgical History:  Procedure Laterality Date  . LAPAROSCOPIC GASTRIC SLEEVE RESECTION WITH HIATAL HERNIA REPAIR N/A 08/25/2014   Procedure: LAPAROSCOPIC GASTRIC SLEEVE RESECTION WITH HIATAL HERNIA REPAIR;  Surgeon: Gaynelle Adu, MD;  Location: [facility];  Service: General;  Laterality: N/A;  . UPPER GI ENDOSCOPY N/A 08/25/2014   Procedure: UPPER GI ENDOSCOPY;  Surgeon: Gaynelle Adu, MD;  Location: [facility];  Service: General;  Laterality: N/A;  . WISDOM TOOTH EXTRACTION     Social History  Substance Use Topics  . Smoking status: Never Smoker  . Smokeless tobacco: Never Used  . Alcohol use Yes     Comment: rare- social   Family History  Problem Relation Age of Onset  . Depression Mother   . Diabetes Mother   . Depression Brother   . Hyperlipidemia Brother   . Diabetes Maternal Aunt   . Depression Maternal Aunt   . Alcohol abuse Maternal Uncle      Current medication list and allergy/intolerance information reviewed:   Current Outpatient Prescriptions  Medication Sig Dispense Refill  . buPROPion (WELLBUTRIN XL) 150 MG 24 hr tablet TAKE 2 TABLETS(300 MG) BY MOUTH DAILY 60 tablet 0  . FLUoxetine (PROZAC) 20 MG tablet TAKE 1 TABLET(20 MG) BY MOUTH DAILY 30 tablet 0  . folic acid  (FOLVITE) 1 MG tablet Take 1 tablet (1 mg total) by mouth daily. 30 tablet 3  . Melatonin 5 MG CAPS Take by mouth.    . thiamine 50 MG tablet Take 0.5 tablets (25 mg total) by mouth daily. 20 tablet 0  . Vitamin D, Ergocalciferol, (DRISDOL) 50000 units CAPS capsule Take 1 capsule (50,000 Units total) by mouth every 7 (seven) days. Take for 8 total doses(weeks) 4 capsule 3   No current facility-administered medications for this visit.    No Known Allergies    Review of Systems:  Constitutional:  No  fever, no chills, No recent illness  Gastrointestinal: No  abdominal pain, No  nausea, No  vomiting,  No  blood in stool, No  diarrhea, No  constipation   Genitourinary: No  abnormal genital bleeding, No abnormal genital discharge  Exam:  BP 116/80   Pulse 78   Ht  (1.6 m)   Wt 256 lb (116.1 kg)   BMI 45.35 kg/m   Constitutional: VS see above. General Appearance: alert, well-developed, well-nourished, NAD  GYN: No lesions/ulcers to external genitalia, normal urethra, normal vaginal mucosa, physiologic discharge, cervix normal without lesions, IUD strings visible     ASSESSMENT/PLAN:    Encounter for IUD removal  Cervical cancer screening - Plan: Cytology - PAP  Routine screening for STI (sexually transmitted infection) - Plan: Cytology - PAP  Encounter  for other contraceptive management   Procedure: IUD removal Speculum was placed in the vagina and the cervix is visualized. IUD strings were visible protruding from the vagina. Strings were grasped with ring forceps and IUD was removed easily. Pap test was collected at time of IUD removal. Patient tolerated procedure well.   Visit summary with medication list and pertinent instructions was printed for patient to review. All questions at time of visit were answered - patient instructed to contact office with any additional concerns. ER/RTC precautions were reviewed with the patient. Follow-up plan: Return as needed / when  due for annual physical.

## 2015-10-12 NOTE — Patient Instructions (Addendum)
If you like to discuss alternative methods of birth control, or if you desire IUD reinsertion, please let me know and we can arrange something for you.

## 2015-10-14 LAB — CYTOLOGY - PAP

## 2015-10-15 ENCOUNTER — Encounter (HOSPITAL_COMMUNITY): Payer: Self-pay | Admitting: Psychiatry

## 2015-10-15 ENCOUNTER — Telehealth: Payer: Self-pay

## 2015-10-15 ENCOUNTER — Ambulatory Visit (INDEPENDENT_AMBULATORY_CARE_PROVIDER_SITE_OTHER): Payer: 59 | Admitting: Psychiatry

## 2015-10-15 VITALS — BP 122/80 | HR 83 | Ht 67.0 in | Wt 257.0 lb

## 2015-10-15 DIAGNOSIS — F411 Generalized anxiety disorder: Secondary | ICD-10-CM

## 2015-10-15 DIAGNOSIS — F0630 Mood disorder due to known physiological condition, unspecified: Secondary | ICD-10-CM

## 2015-10-15 DIAGNOSIS — F4310 Post-traumatic stress disorder, unspecified: Secondary | ICD-10-CM | POA: Diagnosis not present

## 2015-10-15 DIAGNOSIS — Z8659 Personal history of other mental and behavioral disorders: Secondary | ICD-10-CM

## 2015-10-15 DIAGNOSIS — F330 Major depressive disorder, recurrent, mild: Secondary | ICD-10-CM

## 2015-10-15 MED ORDER — FLUOXETINE HCL 10 MG PO TABS: 30.0000 mg | ORAL_TABLET | Freq: Every day | ORAL | 1 refills | Status: DC

## 2015-10-15 MED ORDER — BUPROPION HCL ER (XL) 150 MG PO TB24: ORAL_TABLET | ORAL | 1 refills | Status: DC

## 2015-10-15 MED ORDER — METRONIDAZOLE 500 MG PO TABS: 500.0000 mg | ORAL_TABLET | Freq: Two times a day (BID) | ORAL | 0 refills | Status: AC

## 2015-10-15 NOTE — Telephone Encounter (Signed)
Okay, sent this in. Can let patient know that the pharmacy should have that prescription. No alcohol while taking this medication

## 2015-10-15 NOTE — Telephone Encounter (Signed)
Called patient gave her results she stated that she would like an antibiotic for BV sent to her pharmacy. Please advise. Rhonda Cunningham,CMA

## 2015-10-15 NOTE — Telephone Encounter (Signed)
Patient has been informed. Rhonda Cunningham,CMA  
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
## 2015-10-16 LAB — CERVICOVAGINAL ANCILLARY ONLY: CANDIDA VAGINITIS: NEGATIVE

## 2015-10-19 ENCOUNTER — Other Ambulatory Visit (HOSPITAL_COMMUNITY): Payer: Self-pay | Admitting: Psychiatry

## 2015-10-19 NOTE — Telephone Encounter (Signed)
Received fax from [name] requesting a refill for Prozac 10mg . Per Dr. Gilmore LarocheAkhtar, refill was escribed to pharmacy on 10/15/15 for Prozac 10mg , #90 w/ 1 refill. Pt is schedule for a f/u appt on 12/08/15. Called and informed pt of refill status. Pt shows understanding.

## 2015-10-20 ENCOUNTER — Other Ambulatory Visit (HOSPITAL_COMMUNITY): Payer: Self-pay | Admitting: Psychiatry

## 2015-10-20 NOTE — Telephone Encounter (Signed)
Received fax from [name] requesting a refill for Wellbutrin 150mg . Per Dr. Gilmore LarocheAkhtar, refill was escribed to pharmacy on 10/15/15 for Wellbutrin 150mg , #60 w/ 1 refill. Pt is schedule for a f/u appt on 12/08/15. Called and informed pt of refill status. Pt shows understanding.

## 2015-10-28 ENCOUNTER — Ambulatory Visit (HOSPITAL_COMMUNITY): Payer: Self-pay | Admitting: Licensed Clinical Social Worker

## 2015-11-29 ENCOUNTER — Other Ambulatory Visit (HOSPITAL_COMMUNITY): Payer: Self-pay | Admitting: Psychiatry

## 2015-11-30 NOTE — Telephone Encounter (Signed)
Received fax from [facility] requesting refills for Wellbutrin and Prozac. Per Dr. Akhtar, refill requests are denied. Next refills are due on 12/15/15. Pt is schedule for a f/u appt on 12/08/15. Called and informed pt of refill status. Pt verbalizes understanding. 

## 2015-12-03 ENCOUNTER — Other Ambulatory Visit (HOSPITAL_COMMUNITY): Payer: Self-pay | Admitting: Psychiatry

## 2015-12-03 NOTE — Telephone Encounter (Signed)
Received fax from [facility] requesting refills for Wellbutrin and Prozac. Per Dr. Gilmore LarocheAkhtar, refill requests are denied. Next refills are due on 12/15/15. Pt is schedule for a f/u appt on 12/08/15. Called and informed pt of refill status. Pt verbalizes understanding.

## 2015-12-08 ENCOUNTER — Encounter (HOSPITAL_COMMUNITY): Payer: Self-pay | Admitting: Psychiatry

## 2015-12-08 ENCOUNTER — Ambulatory Visit (INDEPENDENT_AMBULATORY_CARE_PROVIDER_SITE_OTHER): Payer: 59 | Admitting: Psychiatry

## 2015-12-08 VITALS — BP 124/70 | HR 80 | Ht 67.0 in | Wt 249.8 lb

## 2015-12-08 DIAGNOSIS — F411 Generalized anxiety disorder: Secondary | ICD-10-CM | POA: Diagnosis not present

## 2015-12-08 DIAGNOSIS — F4310 Post-traumatic stress disorder, unspecified: Secondary | ICD-10-CM

## 2015-12-08 DIAGNOSIS — Z8659 Personal history of other mental and behavioral disorders: Secondary | ICD-10-CM

## 2015-12-08 DIAGNOSIS — F0630 Mood disorder due to known physiological condition, unspecified: Secondary | ICD-10-CM

## 2015-12-08 MED ORDER — FLUOXETINE HCL 20 MG PO TABS: 40.0000 mg | ORAL_TABLET | Freq: Every day | ORAL | 1 refills | Status: DC

## 2015-12-08 MED ORDER — BUPROPION HCL ER (XL) 150 MG PO TB24: ORAL_TABLET | ORAL | 1 refills | Status: DC
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
## 2015-12-18 ENCOUNTER — Other Ambulatory Visit: Payer: Self-pay | Admitting: *Deleted

## 2015-12-18 DIAGNOSIS — E559 Vitamin D deficiency, unspecified: Secondary | ICD-10-CM

## 2015-12-18 DIAGNOSIS — E538 Deficiency of other specified B group vitamins: Secondary | ICD-10-CM

## 2015-12-18 MED ORDER — FOLIC ACID 1 MG PO TABS: 1.0000 mg | ORAL_TABLET | Freq: Every day | ORAL | 0 refills | Status: DC

## 2015-12-18 MED ORDER — VITAMIN D (ERGOCALCIFEROL) 1.25 MG (50000 UNIT) PO CAPS
50000.0000 [IU] | ORAL_CAPSULE | ORAL | 0 refills | Status: DC
Start: 2015-12-18 — End: 2017-03-14

## 2015-12-22 ENCOUNTER — Other Ambulatory Visit (HOSPITAL_COMMUNITY): Payer: Self-pay | Admitting: Psychiatry

## 2015-12-22 NOTE — Telephone Encounter (Signed)
Medication refill- Received fax from [facility] requesting refills for Wellbutrin and Prozac. Per Dr. Gilmore LarocheAkhtar, refill requests are denied. Medication refills were sent to pharmacy on 12/08/15 for Prozac 20mg , #60 w/ 1 refill and Wellbutrin 150mg , # 60 w/ 1 refill. Pt is schedule for a f/u appt on 02/09/16. Called and informed pt of refill status. Pt verbalizes understanding.

## 2016-01-21 ENCOUNTER — Other Ambulatory Visit (HOSPITAL_COMMUNITY): Payer: Self-pay | Admitting: Psychiatry

## 2016-01-26 NOTE — Telephone Encounter (Signed)
Received fax [facility] requesting a refill for Wellbutrin and Prozac. Per Dr. Gilmore LarocheAkhtar, refill request for Wellbutrin 150mg , #60 and Prozac 20mg , #60 are authorize. Rx's sent to pharmacy. Pt f/u apt is schedule on 12/5. lvm informing pt of refill status.

## 2016-01-26 NOTE — Telephone Encounter (Addendum)
Received fax [facility] requesting a refill for Wellbutrin and Prozac. Per Dr. Akhtar, refill request for Wellbutrin 150mg, #60 and Prozac 20mg, #60 are authorize. Rx's sent to pharmacy. Pt f/u apt is schedule on 12/5. lvm informing pt of refill status.   

## 2016-02-06 ENCOUNTER — Other Ambulatory Visit: Payer: Self-pay | Admitting: Osteopathic Medicine

## 2016-02-06 DIAGNOSIS — E538 Deficiency of other specified B group vitamins: Secondary | ICD-10-CM

## 2016-02-09 ENCOUNTER — Encounter (HOSPITAL_COMMUNITY): Payer: Self-pay | Admitting: Psychiatry

## 2016-02-09 ENCOUNTER — Ambulatory Visit (INDEPENDENT_AMBULATORY_CARE_PROVIDER_SITE_OTHER): Payer: 59 | Admitting: Psychiatry

## 2016-02-09 VITALS — BP 118/72 | HR 96 | Ht 67.0 in | Wt 242.0 lb

## 2016-02-09 DIAGNOSIS — Z811 Family history of alcohol abuse and dependence: Secondary | ICD-10-CM

## 2016-02-09 DIAGNOSIS — Z8659 Personal history of other mental and behavioral disorders: Secondary | ICD-10-CM | POA: Diagnosis not present

## 2016-02-09 DIAGNOSIS — Z8349 Family history of other endocrine, nutritional and metabolic diseases: Secondary | ICD-10-CM

## 2016-02-09 DIAGNOSIS — Z79899 Other long term (current) drug therapy: Secondary | ICD-10-CM

## 2016-02-09 DIAGNOSIS — F4310 Post-traumatic stress disorder, unspecified: Secondary | ICD-10-CM | POA: Diagnosis not present

## 2016-02-09 DIAGNOSIS — Z818 Family history of other mental and behavioral disorders: Secondary | ICD-10-CM

## 2016-02-09 DIAGNOSIS — F411 Generalized anxiety disorder: Secondary | ICD-10-CM | POA: Diagnosis not present

## 2016-02-09 DIAGNOSIS — F0630 Mood disorder due to known physiological condition, unspecified: Secondary | ICD-10-CM | POA: Diagnosis not present

## 2016-02-09 DIAGNOSIS — Z833 Family history of diabetes mellitus: Secondary | ICD-10-CM

## 2016-02-09 DIAGNOSIS — Z9889 Other specified postprocedural states: Secondary | ICD-10-CM

## 2016-02-09 MED ORDER — BUSPIRONE HCL 10 MG PO TABS: 10.0000 mg | ORAL_TABLET | Freq: Every day | ORAL | 1 refills | Status: DC | PRN

## 2016-02-09 MED ORDER — FLUOXETINE HCL 20 MG PO TABS: 60.0000 mg | ORAL_TABLET | Freq: Every day | ORAL | 1 refills | Status: DC

## 2016-02-09 MED ORDER — BUPROPION HCL ER (XL) 150 MG PO TB24: 300.0000 mg | ORAL_TABLET | Freq: Every day | ORAL | 1 refills | Status: DC
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
## 2016-03-03 ENCOUNTER — Encounter: Payer: Self-pay | Admitting: Osteopathic Medicine

## 2016-03-03 ENCOUNTER — Ambulatory Visit (INDEPENDENT_AMBULATORY_CARE_PROVIDER_SITE_OTHER): Payer: 59 | Admitting: Osteopathic Medicine

## 2016-03-03 VITALS — BP 119/60 | HR 87 | Ht 63.0 in | Wt 247.0 lb

## 2016-03-03 DIAGNOSIS — F0630 Mood disorder due to known physiological condition, unspecified: Secondary | ICD-10-CM | POA: Diagnosis not present

## 2016-03-03 DIAGNOSIS — Z0189 Encounter for other specified special examinations: Secondary | ICD-10-CM | POA: Diagnosis not present

## 2016-03-03 DIAGNOSIS — F4310 Post-traumatic stress disorder, unspecified: Secondary | ICD-10-CM

## 2016-03-03 DIAGNOSIS — Z79899 Other long term (current) drug therapy: Secondary | ICD-10-CM

## 2016-03-03 DIAGNOSIS — Z3490 Encounter for supervision of normal pregnancy, unspecified, unspecified trimester: Secondary | ICD-10-CM

## 2016-03-03 DIAGNOSIS — Z3400 Encounter for supervision of normal first pregnancy, unspecified trimester: Secondary | ICD-10-CM | POA: Insufficient documentation

## 2016-03-03 DIAGNOSIS — F411 Generalized anxiety disorder: Secondary | ICD-10-CM | POA: Diagnosis not present

## 2016-03-03 LAB — POCT URINE PREGNANCY: Preg Test, Ur: POSITIVE — AB

## 2016-03-03 NOTE — Progress Notes (Signed)
HPI: Dorothy Marquez is a 30 y.o. female  who presents to [facility] Dorothy Marquez today, 03/03/16,  for chief complaint of:  Chief Complaint  Patient presents with  . Possible Pregnancy    01/29/16 LMP Patient states this is planned pregnancy Does not have OB/GYN currently No abdominal pain, cramping/vaginal bleeding or discharge Currently taking all medications as noted below, following with psychiatry    Past medical, surgical, social and family history reviewed: Patient Active Problem List   Diagnosis Date Noted  . Episodic tension-type headache, not intractable 10/08/2015  . Mild episode of recurrent major depressive disorder (HCC) 05/25/2015  . Anxiety disorder 05/25/2015  . Mood disorder (HCC) 05/25/2015  . Inattention 05/25/2015  . Need for prophylactic vaccination and inoculation against influenza 05/25/2015  . History of gastric bypass 05/25/2015  . Vitamin D deficiency 05/25/2015  . Folate deficiency 05/25/2015  . Thiamine deficiency 05/25/2015  . Mild obstructive sleep apnea 08/28/2014  . Dyslipidemia 08/26/2014  . Elevated blood pressure 08/26/2014  . Morbid obesity with BMI of 60.0-69.9, adult (HCC) 08/26/2014  . 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 Essential (primary) hypertension 08/08/2013  . Clinical depression 05/30/2013  . HLD (hyperlipidemia) 08/28/2012  . Obstructive apnea 08/28/2012   Past Surgical History:  Procedure Laterality Date  . LAPAROSCOPIC GASTRIC SLEEVE RESECTION WITH HIATAL HERNIA REPAIR N/A 08/25/2014   Procedure: LAPAROSCOPIC GASTRIC SLEEVE RESECTION WITH HIATAL HERNIA REPAIR;  Surgeon: Dorothy Adu, MD;  Location: [facility];  Service: General;  Laterality: N/A;  . UPPER GI ENDOSCOPY N/A 08/25/2014   Procedure: UPPER GI ENDOSCOPY;  Surgeon: Dorothy Adu, MD;  Location: [facility];  Service: General;  Laterality: N/A;  . WISDOM TOOTH EXTRACTION     Social History  Substance Use Topics  . Smoking status: Never  Smoker  . Smokeless tobacco: Never Used  . Alcohol use Yes     Comment: rare- social   Family History  Problem Relation Age of Onset  . Depression Mother   . Diabetes Mother   . Depression Brother   . Hyperlipidemia Brother   . Diabetes Maternal Aunt   . Depression Maternal Aunt   . Alcohol abuse Maternal Uncle      Current medication list and allergy/intolerance information reviewed:   Current Outpatient Prescriptions on File Prior to Visit  Medication Sig Dispense Refill  . buPROPion (WELLBUTRIN XL) 150 MG 24 hr tablet Take 2 tablets (300 mg total) by mouth daily. 60 tablet 1  . busPIRone (BUSPAR) 10 MG tablet Take 1 tablet (10 mg total) by mouth daily as needed. 30 tablet 1  . FLUoxetine (PROZAC) 20 MG tablet Take 3 tablets (60 mg total) by mouth daily. 60 tablet 1  . folic acid (FOLVITE) 1 MG tablet TAKE 1 TABLET BY MOUTH  DAILY 90 tablet 0  . Melatonin 5 MG CAPS Take by mouth.    . Vitamin D, Ergocalciferol, (DRISDOL) 50000 units CAPS capsule Take 1 capsule (50,000 Units total) by mouth every 7 (seven) days. Take for 8 total doses(weeks) 12 capsule 0   No current facility-administered medications on file prior to visit.    No Known Allergies    Review of Systems:  Constitutional: No recent illness  Cardiac: No  chest pain,  Respiratory:  No  shortness of breath  Gastrointestinal: No  abdominal pain  Musculoskeletal: No new myalgia/arthralgia  Skin: No  Rash  Neurologic: No  weakness, No  Dizziness  Exam:  BP 119/60   Pulse 87  Ht 5\' 3"  (1.6 m)   Wt 247 lb (112 kg)   BMI 43.75 kg/m   Constitutional: VS see above. General Appearance: alert, well-developed, well-nourished, NAD  Eyes: Normal lids and conjunctive, non-icteric sclera  Ears, Nose, Mouth, Throat: MMM, Normal external inspection ears/nares/mouth/lips/gums.  Neck: No masses, trachea midline.   Respiratory: Normal respiratory effort. no wheeze, no rhonchi, no rales  Cardiovascular: S1/S2  normal, no murmur, no rub/gallop auscultated. RRR.   Musculoskeletal: Gait normal. Symmetric and independent movement of all extremities  Neurological: Normal balance/coordination. No tremor.  Skin: warm, dry, intact.   Psychiatric: Normal judgment/insight. Normal mood and affect. Oriented x3.    Results for orders placed or performed in visit on 03/03/16 (from the past 72 hour(s))  POCT urine pregnancy     Status: Abnormal   Collection Time: 03/03/16  9:00 AM  Result Value Ref Range   Preg Test, Ur Positive (A) Negative     Recent psychiatry notes reviewed: Last visit 02/09/2016. Undergoing treatment for mood disorder, generalized anxiety disorder, history of panic attacks, PTSD. Treatment plan at that time included increasing Prozac, though this is not currently reflected in her medication list, continue Wellbutrin, added BuSpar.   ASSESSMENT/PLAN:    Pregnancy, unspecified gestational age - Plan: Ambulatory referral to Obstetrics / Gynecology  Patient request for diagnostic testing - Plan: POCT urine pregnancy  Mood disorder in conditions classified elsewhere  GAD (generalized anxiety disorder)  PTSD (post-traumatic stress disorder)  Medication management - Counseled on risks/benefits of discontinuing treatment vsside effects of medicine/effects in pregnancy vs uncontrolled mood disorder/GAD/PTSD - continue for now    Patient Instructions    OK for now to continue current medications - talk more about this with your OB team and with Dr. Gilmore Marquez! Any questions, let me know!    Pregnant women exposed to antidepressants during pregnancy are encouraged to enroll in the [facility] for Antidepress[facility] (NPRAD). Women 4218 to 30 years of age or their health care providers may contact the registry by calling [phone]. Enrollment should be done as early in pregnancy as possible.    Visit summary with medication list and pertinent instructions was printed for  patient to review. All questions at time of visit were answered - patient instructed to contact office with any additional concerns. ER/RTC precautions were reviewed with the patient. Follow-up plan: Return for routine care as needed.  Appendix: Pregnancy indications for current medication list, checked on up-to-date absorption service 02/22/2016  Wellbutrin: "Adverse events have been observed in some animal reproduction studies. Bupropion and its metabolites were found to cross the placenta in in vitro studies (Earhart 2012). An increased risk of congenital malformations has not been observed following maternal use of bupropion during pregnancy; however, data specific to cardiovascular malformations is inconsistent. The long-term effects on development and behavior have not been studied."  Buspirone: "Adverse events have not been observed in animal reproduction studies."  Fluoxetine: "Adverse events have been observed in animal reproduction studies. Fluoxetine and its metabolite cross the human placenta. Available studies evaluating teratogenic effects following maternal use of fluoxetine in the first trimester have shown inconsistent results. An increased risk of cardiovascular events was observed in one study; however, no specific pattern was observed and a causal relationship has not been established. Nonteratogenic effects in the newborn following SSRI/SNRI exposure late in the third trimester include respiratory distress, cyanosis, apnea, seizures, temperature instability, feeding difficulty, vomiting, hypoglycemia, hypo- or hypertonia, hyper-reflexia, jitteriness, irritability, constant crying, and tremor. Symptoms may be  due to the toxicity of the SSRIs/SNRIs or a discontinuation syndrome and may be consistent with serotonin syndrome associated with SSRI treatment. Persistent pulmonary hypertension of the newborn (PPHN) has also been reported with SSRI exposure. The long-term effects of in utero SSRI  exposure on infant development and behavior are not known. Due to pregnancy-induced physiologic changes, women who are pregnant may require dose adjustments of fluoxetine to achieve euthymia. The ACOG recommends that therapy with SSRIs or SNRIs during pregnancy be individualized; treatment of depression during pregnancy should incorporate the clinical expertise of the mental health clinician, obstetrician, primary health care provider, and pediatrician. According to the American Psychiatric Association (APA), the risks of medication treatment should be weighed against other treatment options and untreated depression. For women who discontinue antidepressant medications during pregnancy and who may be at high risk for postpartum depression, the medications can be restarted following delivery. Treatment algorithms have been developed by the ACOG and the APA for the management of depression in women prior to conception and during pregnancy."  "Pregnant women exposed to antidepressants during pregnancy are encouraged to enroll in the [facility] P[facility] for Antidepressants (NPRAD). Women 8418 to 30 years of age or their health care providers may contact the registry by calling [phone]. Enrollment should be done as early in pregnancy as possible."

## 2016-03-03 NOTE — Patient Instructions (Signed)
   OK for now to continue current medications - talk more about this with your OB team and with Dr. Gilmore LarocheAkhtar! Any questions, let me know!    Pregnant women exposed to antidepressants during pregnancy are encouraged to enroll in the [facility] for Antidepressants (NPRAD). Women 2818 to 30 years of age or their health care providers may contact the registry by calling [phone]. Enrollment should be done as early in pregnancy as possible.

## 2016-03-22 ENCOUNTER — Encounter (HOSPITAL_COMMUNITY): Payer: Self-pay

## 2016-04-07 ENCOUNTER — Ambulatory Visit (HOSPITAL_COMMUNITY): Payer: Self-pay | Admitting: Psychiatry

## 2016-05-05 ENCOUNTER — Ambulatory Visit (HOSPITAL_COMMUNITY): Payer: Self-pay | Admitting: Psychiatry

## 2016-05-19 ENCOUNTER — Other Ambulatory Visit: Payer: Self-pay | Admitting: Osteopathic Medicine

## 2016-05-19 DIAGNOSIS — E538 Deficiency of other specified B group vitamins: Secondary | ICD-10-CM

## 2016-05-23 ENCOUNTER — Telehealth (HOSPITAL_COMMUNITY): Payer: Self-pay | Admitting: *Deleted

## 2016-05-23 NOTE — Telephone Encounter (Signed)
Pt has made an appt for tomorrow 3/20. Pt will get refill at appt.   Nothing further needed at this time.

## 2016-05-23 NOTE — Telephone Encounter (Signed)
Pt lvm on 05/22/16 requesting refills for medications. Pt was last seen 02/2016, no showed apt on 05/05/16. Lvm for pt to return call to office. Pt will need to schedule an apt. Nothing further is needed at this time.

## 2016-05-24 ENCOUNTER — Encounter (HOSPITAL_COMMUNITY): Payer: Self-pay | Admitting: Psychiatry

## 2016-05-24 ENCOUNTER — Ambulatory Visit (INDEPENDENT_AMBULATORY_CARE_PROVIDER_SITE_OTHER): Payer: 59 | Admitting: Psychiatry

## 2016-05-24 VITALS — BP 126/74 | HR 87 | Resp 16 | Ht 62.0 in | Wt 243.0 lb

## 2016-05-24 DIAGNOSIS — Z8659 Personal history of other mental and behavioral disorders: Secondary | ICD-10-CM

## 2016-05-24 DIAGNOSIS — F411 Generalized anxiety disorder: Secondary | ICD-10-CM | POA: Diagnosis not present

## 2016-05-24 DIAGNOSIS — F4310 Post-traumatic stress disorder, unspecified: Secondary | ICD-10-CM

## 2016-05-24 DIAGNOSIS — Z818 Family history of other mental and behavioral disorders: Secondary | ICD-10-CM | POA: Diagnosis not present

## 2016-05-24 DIAGNOSIS — Z79899 Other long term (current) drug therapy: Secondary | ICD-10-CM

## 2016-05-24 DIAGNOSIS — Z811 Family history of alcohol abuse and dependence: Secondary | ICD-10-CM | POA: Diagnosis not present

## 2016-05-24 DIAGNOSIS — F0630 Mood disorder due to known physiological condition, unspecified: Secondary | ICD-10-CM | POA: Diagnosis not present

## 2016-05-24 DIAGNOSIS — G4700 Insomnia, unspecified: Secondary | ICD-10-CM

## 2016-05-24 MED ORDER — FLUOXETINE HCL 20 MG PO TABS: 20.0000 mg | ORAL_TABLET | Freq: Every day | ORAL | 0 refills | Status: DC
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
## 2016-06-23 ENCOUNTER — Ambulatory Visit (INDEPENDENT_AMBULATORY_CARE_PROVIDER_SITE_OTHER): Payer: 59 | Admitting: Psychiatry

## 2016-06-23 ENCOUNTER — Encounter (HOSPITAL_COMMUNITY): Payer: Self-pay | Admitting: Psychiatry

## 2016-06-23 VITALS — BP 126/70 | HR 83 | Resp 16 | Ht 62.0 in | Wt 246.0 lb

## 2016-06-23 DIAGNOSIS — Z79899 Other long term (current) drug therapy: Secondary | ICD-10-CM

## 2016-06-23 DIAGNOSIS — F0630 Mood disorder due to known physiological condition, unspecified: Secondary | ICD-10-CM | POA: Diagnosis not present

## 2016-06-23 DIAGNOSIS — Z818 Family history of other mental and behavioral disorders: Secondary | ICD-10-CM

## 2016-06-23 DIAGNOSIS — Z811 Family history of alcohol abuse and dependence: Secondary | ICD-10-CM

## 2016-06-23 DIAGNOSIS — Z8659 Personal history of other mental and behavioral disorders: Secondary | ICD-10-CM | POA: Diagnosis not present

## 2016-06-23 DIAGNOSIS — F411 Generalized anxiety disorder: Secondary | ICD-10-CM

## 2016-06-23 DIAGNOSIS — F4310 Post-traumatic stress disorder, unspecified: Secondary | ICD-10-CM

## 2016-06-23 DIAGNOSIS — G4700 Insomnia, unspecified: Secondary | ICD-10-CM

## 2016-06-23 MED ORDER — FLUOXETINE HCL 20 MG PO TABS: 20.0000 mg | ORAL_TABLET | Freq: Every day | ORAL | 1 refills | Status: DC

## 2016-06-23 NOTE — Progress Notes (Signed)
[facility] Outpatient Follow up visit  Patient Identification: Dorothy Marquez MRN:  [id number] Date of Evaluation:  06/23/2016 Referral Source: Primary care Chief Complaint:   Chief Complaint    Follow-up     Visit Diagnosis:    ICD-9-CM ICD-10-CM   1. Mood disorder in conditions classified elsewhere 293.83 F06.30   2. GAD (generalized anxiety disorder) 300.02 F41.1   3. History of panic attacks V11.8 Z86.59   4. PTSD (post-traumatic stress disorder) 309.81 F43.10     History of Present Illness:  31 years old currently single Caucasian female living with her boyfriend and one son 25 years of age. Referred initially  for management of depression and mood swings  Patient is 22 weeks' pregnant last was restarted back Prozac 20 mg because of concerns over depression. It has helped her depression and anxiety she still feels somewhat a motivated but overall mood is improvedthere is no reported side effects.  Denies drug use anxiety infrequent panic attacks but not regularly. Some flashbacks but overall thinking positive and lo  Modifying factor:relationship Severity of depression:7/10   Past Medical History:  Past Medical History:  Diagnosis Date  . Anxiety   . Depression   . History of hiatal hernia   . History of kidney stones    x5 episodes    Past Surgical History:  Procedure Laterality Date  . LAPAROSCOPIC GASTRIC SLEEVE RESECTION WITH HIATAL HERNIA REPAIR N/A 08/25/2014   Procedure: LAPAROSCOPIC GASTRIC SLEEVE RESECTION WITH HIATAL HERNIA REPAIR;  Surgeon: Gaynelle Adu, MD;  Location: [facility];  Service: General;  Laterality: N/A;  . UPPER GI ENDOSCOPY N/A 08/25/2014   Procedure: UPPER GI ENDOSCOPY;  Surgeon: Gaynelle Adu, MD;  Location: [facility];  Service: General;  Laterality: N/A;  . WISDOM TOOTH EXTRACTION        Family History:  Family History  Problem Relation Age of Onset  . Depression Mother   . Diabetes Mother   . Depression Brother   . Hyperlipidemia Brother   .  Diabetes Maternal Aunt   . Depression Maternal Aunt   . Alcohol abuse Maternal Uncle     Social History:   Social History   Social History  . Marital status: Single    Spouse name: N/A  . Number of children: N/A  . Years of education: N/A   Social History Main Topics  . Smoking status: Never Smoker  . Smokeless tobacco: Never Used  . Alcohol use Yes     Comment: rare- social  . Drug use: No  . Sexual activity: Yes   Other Topics Concern  . None   Social History Narrative  . None    Allergies:  No Known Allergies  Metabolic Disorder Labs: Lab Results  Component Value Date   HGBA1C 5.1 01/22/2014   MPG 100 01/22/2014   No results found for: PROLACTIN Lab Results  Component Value Date   CHOL 187 05/18/2015   TRIG 71 05/18/2015   HDL 43 (L) 05/18/2015   CHOLHDL 4.3 05/18/2015   VLDL 14 05/18/2015   LDLCALC 130 (H) 05/18/2015   LDLCALC 108 (H) 01/22/2014     Current Medications: Current Outpatient Prescriptions  Medication Sig Dispense Refill  . FLUoxetine (PROZAC) 20 MG tablet Take 1 tablet (20 mg total) by mouth daily. 30 tablet 1  . folic acid (FOLVITE) 1 MG tablet TAKE 1 TABLET BY MOUTH  DAILY 90 tablet 0  . Melatonin 5 MG CAPS Take by mouth.    . Vitamin D,  Ergocalciferol, (DRISDOL) 50000 units CAPS capsule Take 1 capsule (50,000 Units total) by mouth every 7 (seven) days. Take for 8 total doses(weeks) 12 capsule 0   No current facility-administered medications for this visit.       Psychiatric Specialty Exam: Review of Systems  Constitutional: Negative for fever.  Cardiovascular: Negative for palpitations.  Skin: Negative for itching.  Neurological: Negative for tingling.  Psychiatric/Behavioral: Negative for suicidal ideas.    Blood pressure 126/70, pulse 83, resp. rate 16, height  (1.575 m), weight 246 lb (111.6 kg), last menstrual period 01/22/2016, SpO2 100 %.Body mass index is 44.99 kg/m.  General Appearance: Casual  Eye Contact:   Good  Speech:  Clear and Coherent  Volume:  Decreased  Mood: euthymic  Affect:  congruent  Thought Process:  Goal Directed  Orientation:  Full (Time, Place, and Person)  Thought Content:  Rumination  Suicidal Thoughts:  No  Homicidal Thoughts:  No  Memory:  Immediate;   Fair Recent;   Fair  Judgement:  Fair  Insight:  Fair  Psychomotor Activity:  Normal  Concentration:  Concentration: Fair and Attention Span: Fair  Recall:  [employer] of Knowledge:Fair  Language: Fair  Akathisia:  Negative  Handed:  Right  AIMS (if indicated):    Assets:  Desire for Improvement  ADL's:  Intact  Cognition: WNL  Sleep:  Variable to poor    Treatment Plan Summary: Medication management and Plan as follows  1. Mood disorder/ Depressio: Prozac is helped we will continue 20 mg not increase it Patient OB/GYN is aware and has okayed to keep up this medication  2. GAD: improved. Continue prozac 3. Insomnia: improved  PTSD: baseline Ore stable mood symptoms follow up in 2-3 months continue prescribed  Prozac. Discussed side effects questions were addressed    Thresa Ross, MD 4/19/20183:39 PM

## 2016-08-15 ENCOUNTER — Ambulatory Visit (INDEPENDENT_AMBULATORY_CARE_PROVIDER_SITE_OTHER): Payer: 59 | Admitting: Psychiatry

## 2016-08-15 ENCOUNTER — Encounter (HOSPITAL_COMMUNITY): Payer: Self-pay | Admitting: Psychiatry

## 2016-08-15 VITALS — BP 126/72 | HR 94 | Resp 16 | Ht 62.0 in | Wt 251.0 lb

## 2016-08-15 DIAGNOSIS — F0630 Mood disorder due to known physiological condition, unspecified: Secondary | ICD-10-CM

## 2016-08-15 DIAGNOSIS — F329 Major depressive disorder, single episode, unspecified: Secondary | ICD-10-CM | POA: Diagnosis not present

## 2016-08-15 DIAGNOSIS — Z818 Family history of other mental and behavioral disorders: Secondary | ICD-10-CM

## 2016-08-15 DIAGNOSIS — F411 Generalized anxiety disorder: Secondary | ICD-10-CM | POA: Diagnosis not present

## 2016-08-15 DIAGNOSIS — Z811 Family history of alcohol abuse and dependence: Secondary | ICD-10-CM

## 2016-08-15 DIAGNOSIS — F4310 Post-traumatic stress disorder, unspecified: Secondary | ICD-10-CM | POA: Diagnosis not present

## 2016-08-15 DIAGNOSIS — Z8659 Personal history of other mental and behavioral disorders: Secondary | ICD-10-CM

## 2016-08-15 DIAGNOSIS — Z79899 Other long term (current) drug therapy: Secondary | ICD-10-CM

## 2016-08-15 DIAGNOSIS — Z3A00 Weeks of gestation of pregnancy not specified: Secondary | ICD-10-CM

## 2016-08-15 DIAGNOSIS — O99343 Other mental disorders complicating pregnancy, third trimester: Secondary | ICD-10-CM

## 2016-08-15 MED ORDER — FLUOXETINE HCL 20 MG PO TABS: 30.0000 mg | ORAL_TABLET | Freq: Every day | ORAL | 1 refills | Status: DC

## 2016-08-15 NOTE — Progress Notes (Signed)
[facility] Outpatient Follow up visit  Patient Identification: Dorothy Marquez MRN:  [id number] Date of Evaluation:  08/15/2016 Referral Source: Primary care Chief Complaint:   Chief Complaint    Follow-up     Visit Diagnosis:  Major depression. GAD History of Present Illness:  31 years old currently single Caucasian female living with her boyfriend and one son 20 years of age. Referred initially  for management of depression and mood swings  Patient is 7 months pregnant. Continue to work. Feeling somewhat sad but not hopeless.  Denies drug use Sleep fair to difficult Some flashbacks from past She feels med need be increased as she is feeling down  Modifying factor:relationship Severity of depression:7/10   Past Medical History:  Past Medical History:  Diagnosis Date  . Anxiety   . Depression   . History of hiatal hernia   . History of kidney stones    x5 episodes    Past Surgical History:  Procedure Laterality Date  . LAPAROSCOPIC GASTRIC SLEEVE RESECTION WITH HIATAL HERNIA REPAIR N/A 08/25/2014   Procedure: LAPAROSCOPIC GASTRIC SLEEVE RESECTION WITH HIATAL HERNIA REPAIR;  Surgeon: Gaynelle Adu, MD;  Location: [facility];  Service: General;  Laterality: N/A;  . UPPER GI ENDOSCOPY N/A 08/25/2014   Procedure: UPPER GI ENDOSCOPY;  Surgeon: Gaynelle Adu, MD;  Location: [facility];  Service: General;  Laterality: N/A;  . WISDOM TOOTH EXTRACTION        Family History:  Family History  Problem Relation Age of Onset  . Depression Mother   . Diabetes Mother   . Depression Brother   . Hyperlipidemia Brother   . Diabetes Maternal Aunt   . Depression Maternal Aunt   . Alcohol abuse Maternal Uncle     Social History:   Social History   Social History  . Marital status: Single    Spouse name: N/A  . Number of children: N/A  . Years of education: N/A   Social History Main Topics  . Smoking status: Never Smoker  . Smokeless tobacco: Never Used  . Alcohol use Yes     Comment: rare-  social  . Drug use: No  . Sexual activity: Yes   Other Topics Concern  . None   Social History Narrative  . None    Allergies:  No Known Allergies  Metabolic Disorder Labs: Lab Results  Component Value Date   HGBA1C 5.1 01/22/2014   MPG 100 01/22/2014   No results found for: PROLACTIN Lab Results  Component Value Date   CHOL 187 05/18/2015   TRIG 71 05/18/2015   HDL 43 (L) 05/18/2015   CHOLHDL 4.3 05/18/2015   VLDL 14 05/18/2015   LDLCALC 130 (H) 05/18/2015   LDLCALC 108 (H) 01/22/2014     Current Medications: Current Outpatient Prescriptions  Medication Sig Dispense Refill  . FLUoxetine (PROZAC) 20 MG tablet Take 1 tablet (20 mg total) by mouth daily. 30 tablet 1  . folic acid (FOLVITE) 1 MG tablet TAKE 1 TABLET BY MOUTH  DAILY 90 tablet 0  . Prenatal Multivit-Min-Fe-FA (PRE-NATAL PO) Take by mouth.    . Vitamin D, Ergocalciferol, (DRISDOL) 50000 units CAPS capsule Take 1 capsule (50,000 Units total) by mouth every 7 (seven) days. Take for 8 total doses(weeks) 12 capsule 0  . Melatonin 5 MG CAPS Take by mouth.     No current facility-administered medications for this visit.       Psychiatric Specialty Exam: Review of Systems  Constitutional: Negative for fever.  Cardiovascular:  Negative for chest pain.  Skin: Negative for itching.  Neurological: Negative for tingling.  Psychiatric/Behavioral: Negative for depression and suicidal ideas.    Blood pressure 126/72, pulse 94, resp. rate 16, height 5\' 2"  (1.575 m), weight 251 lb (113.9 kg), last menstrual period 01/22/2016, SpO2 99 %.Body mass index is 45.91 kg/m.  General Appearance: Casual  Eye Contact:  Good  Speech:  Clear and Coherent  Volume:  Decreased  Mood:somewhat dysphoric  Affect:  congruent  Thought Process:  Goal Directed  Orientation:  Full (Time, Place, and Person)  Thought Content:  Rumination  Suicidal Thoughts:  No  Homicidal Thoughts:  No  Memory:  Immediate;   Fair Recent;   Fair   Judgement:  Fair  Insight:  Fair  Psychomotor Activity:  Normal  Concentration:  Concentration: Fair and Attention Span: Fair  Recall:  [employer]iservFair  Fund of Knowledge:Fair  Language: Fair  Akathisia:  Negative  Handed:  Right  AIMS (if indicated):    Assets:  Desire for Improvement  ADL's:  Intact  Cognition: WNL  Sleep:  Variable to poor    Treatment Plan Summary: Medication management and Plan as follows  1. Mood disorder/ Depressio: Increase prozac to 30mg . Now is 7 months plus pregnat Patient OB/GYN is aware and has okayed with this med.  2. VOZ:DGUYQIHKGAD:baseline. Continue prozac  3. Insomnia: reviewed sleep hygiene. It fluctuates   PTSD: baseline. Continue prozac Has support system fU 4 - 6 weeks. Reviewed side effects    Thresa RossAKHTAR, Kema Santaella, MD 6/11/20183:13 PM

## 2016-09-14 ENCOUNTER — Other Ambulatory Visit: Payer: Self-pay | Admitting: Osteopathic Medicine

## 2016-09-14 ENCOUNTER — Ambulatory Visit (INDEPENDENT_AMBULATORY_CARE_PROVIDER_SITE_OTHER): Payer: 59 | Admitting: Osteopathic Medicine

## 2016-09-14 ENCOUNTER — Encounter: Payer: Self-pay | Admitting: Osteopathic Medicine

## 2016-09-14 VITALS — BP 114/77 | HR 118 | Temp 98.1°F | Wt 242.0 lb

## 2016-09-14 DIAGNOSIS — Z3490 Encounter for supervision of normal pregnancy, unspecified, unspecified trimester: Secondary | ICD-10-CM

## 2016-09-14 DIAGNOSIS — H6982 Other specified disorders of Eustachian tube, left ear: Secondary | ICD-10-CM | POA: Diagnosis not present

## 2016-09-14 DIAGNOSIS — R000 Tachycardia, unspecified: Secondary | ICD-10-CM | POA: Diagnosis not present

## 2016-09-14 LAB — COMPLETE METABOLIC PANEL WITH GFR
ALBUMIN: 3.5 g/dL — AB (ref 3.6–5.1)
ALK PHOS: 112 U/L (ref 33–115)
ALT: 39 U/L — ABNORMAL HIGH (ref 6–29)
AST: 26 U/L (ref 10–30)
BILIRUBIN TOTAL: 0.6 mg/dL (ref 0.2–1.2)
BUN: 7 mg/dL (ref 7–25)
CO2: 17 mmol/L — ABNORMAL LOW (ref 20–31)
Calcium: 8.9 mg/dL (ref 8.6–10.2)
Chloride: 106 mmol/L (ref 98–110)
Creat: 0.57 mg/dL (ref 0.50–1.10)
GFR, Est African American: >89 mL/min (ref >=60)
GFR, Est Non African American: >89 mL/min (ref >=60)
GLUCOSE: 75 mg/dL (ref 65–99)
POTASSIUM: 3.8 mmol/L (ref 3.5–5.3)
SODIUM: 134 mmol/L — AB (ref 135–146)
TOTAL PROTEIN: 6 g/dL — AB (ref 6.1–8.1)

## 2016-09-14 LAB — EKG 12-LEAD

## 2016-09-14 LAB — CBC
HCT: 42.5 % (ref 35.0–45.0)
HEMOGLOBIN: 14.1 g/dL (ref 11.7–15.5)
MCH: 33.3 pg — ABNORMAL HIGH (ref 27.0–33.0)
MCHC: 33.2 g/dL (ref 32.0–36.0)
MCV: 100.2 fL — ABNORMAL HIGH (ref 80.0–100.0)
MPV: 11.1 fL (ref 7.5–12.5)
Platelets: 311 10*3/uL (ref 140–400)
RBC: 4.24 MIL/uL (ref 3.80–5.10)
RDW: 12.3 % (ref 11.0–15.0)
WBC: 11.4 10*3/uL — AB (ref 3.8–10.8)

## 2016-09-14 NOTE — Progress Notes (Signed)
HPI: [name] is a 31 y.o. female  who [facility] [place],  for chief complaint of:  Chief Complaint  Patient presents with  . Ear Pain  . Tachycardia    Patient present to office with complaints of ear issues - feeling like ear underwater, fullness, occasional like she can't hear, typically on R side. Has been cleaning ears frequently. (+)Sinus pressure Light sensitivity, no headache, no vision change.  Patient was found to be tachycardic on intake vitals up into the 120s. Tachycardia persisted despite breast so EKG was performed, see below. Patient denies symptoms of chest pain, palpitations, lightheadedness, dizziness, shortness of breath, cough, lower extremity swelling out of the ordinary, abdominal pain, uterine cramping, foul-smelling or unusual vaginal discharge, bleeding. She is able to lie down and stand up quickly without orthostatic symptoms. She denies illicit drug use but reports may have taken some Sudafed for sinus issues.   Uncertain of gestational age, she is due in later August. She has appointment with OB/GYN tomorrow for fetal nonstress test, states that the baby's brain is not as big as would be expected for gestational age.    Past medical history, surgical history, social history and family history reviewed.  Patient Active Problem List   Diagnosis Date Noted  . Pregnancy 03/03/2016  . PTSD (post-traumatic stress disorder) 03/03/2016  . Episodic tension-type headache, not intractable 10/08/2015  . Mild episode of recurrent major depressive disorder (HCC) 05/25/2015  . GAD (generalized anxiety disorder) 05/25/2015  . Mood disorder (HCC) 05/25/2015  . Inattention 05/25/2015  . Need for prophylactic vaccination and inoculation against influenza 05/25/2015  . History of gastric bypass 05/25/2015  . Vitamin D deficiency 05/25/2015  . Folate deficiency 05/25/2015  . Thiamine deficiency 05/25/2015  . Mild  obstructive sleep apnea 08/28/2014  . Dyslipidemia 08/26/2014  . Elevated blood pressure 08/26/2014  . Morbid obesity with BMI of 60.0-69.9, adult (HCC) 08/26/2014  . 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 Essential (primary) hypertension 08/08/2013  . Clinical depression 05/30/2013  . HLD (hyperlipidemia) 08/28/2012  . Obstructive apnea 08/28/2012    Current medication list and allergy/intolerance information reviewed.   Current Outpatient Prescriptions on File Prior to Visit  Medication Sig Dispense Refill  . FLUoxetine (PROZAC) 20 MG tablet Take 1.5 tablets (30 mg total) by mouth daily. 45 tablet 1  . folic acid (FOLVITE) 1 MG tablet TAKE 1 TABLET BY MOUTH  DAILY 90 tablet 0  . Prenatal Multivit-Min-Fe-FA (PRE-NATAL PO) Take by mouth.    . Vitamin D, Ergocalciferol, (DRISDOL) 50000 units CAPS capsule Take 1 capsule (50,000 Units total) by mouth every 7 (seven) days. Take for 8 total doses(weeks) 12 capsule 0  . Melatonin 5 MG CAPS Take by mouth.    . [DISCONTINUED] buPROPion (WELLBUTRIN XL) 150 MG 24 hr tablet Take 2 tablets (300 mg total) by mouth daily. 60 tablet 1   No current facility-administered medications on file prior to visit.    No Known Allergies    Review of Systems:  Constitutional: No recent illness  HEENT: No  headache, no vision change, ear complaints as per history of present illness  Cardiac: No  chest pain, No  pressure, No palpitations  Respiratory:  No  shortness of breath. No  Cough  Gastrointestinal: No  abdominal pain, no change on bowel habits  Musculoskeletal: No new myalgia/arthralgia  Skin: No  Rash  Hem/Onc: No  easy bruising/bleeding, No  abnormal lumps/bumps  Neurologic: No  weakness, No  Dizziness  Psychiatric: No  concerns with depression, No  concerns with anxiety  Exam:  BP 114/77   Pulse (!) 118   Temp 98.1 F (36.7 C) (Oral)   Wt 242 lb (109.8 kg)   LMP 01/22/2016   BMI 44.26 kg/m   Constitutional: VS see  above. General Appearance: alert, well-developed, well-nourished, NAD  Eyes: Normal lids and conjunctive, non-icteric sclera. Extraocular movements intact, pupils equal round reactive to light and accommodation, do not seem significantly dilated. Limited funduscopic exam, no abnormalities  Ears, Nose, Mouth, Throat: MMM, Normal external inspection ears/nares/mouth/lips/gums. External canals clear, tympanic membranes normal bilaterally with no effusion.  Neck: No masses, trachea midline.   Respiratory: Normal respiratory effort. no wheeze, no rhonchi, no rales  Cardiovascular: S1/S2 normal, no murmur, no rub/gallop auscultated. Tachycardic but regular.   Musculoskeletal: Gait normal. Symmetric and independent movement of all extremities  Neurological: Normal balance/coordination. No tremor.  Abdominal/Uterine: Obesity limits exam, gravid uterus is palpable but not obviously protruding (?IUGR?) Fetal heart tones unable to be ascertained on Doppler, ultrasound was performed at bedside, limited review of anatomy appears grossly normal but I would certainly not trust my read on this entirely, we were able to see cardiac activity.  Skin: warm, dry, intact.   Psychiatric: Normal judgment/insight. Normal mood and affect. Oriented x3.   EKG interpretation: Rate: 119 Rhythm: sinus No ST/T changes concerning for acute ischemia/infarct    ASSESSMENT/PLAN:   Given the patient is totally asymptomatic other than ear and eye complaints, somewhat reassuring however I don't have a good idea for what may be causing this tachycardia. She does not want to go to the hospital if she can help it.   Given the limited value of d-dimer in pregnancy, I would probably defer VQ scan to OB/GYN if symptoms persist, patient is advised to bring up her symptoms at her visit tomorrow. I have called the emergency number for [facility] and left a message on their line with a summary of the situation and my cell phone  number.   Patient was extensively precautioned on reasons to go to emergency department or seek further care and she verbalizes understanding. At this point, will await lab results and monitor.   I'm not so worried about the ears at this point, can trial Flonase for possible eustachian tube dysfunction, no other concerning neurologic findings at this point but keeping in mind the photosensitivity as well, may need to consider CT scan.  Sinus tachycardia by electrocardiogram - Plan: CBC, COMPLETE METABOLIC PANEL WITH GFR, TSH, Urinalysis, Routine w reflex microscopic  Tachycardia - Plan: EKG 12-Lead, CBC, COMPLETE METABOLIC PANEL WITH GFR, TSH, Urinalysis, Routine w reflex microscopic, Drug Screen, Urine, Urine Microscopic  Dysfunction of left eustachian tube  Pregnancy, unspecified gestational age    Patient Instructions  Plan:  If chest pain, dizziness, lightheaded, short of breath, vaginal bleeding, vision changes, pain or any other concerning symptoms - TO HOSPITAL!  Please discuss your symptoms with your OBGYN team!   For ears - avoid over-the-counter medications unless discuss with me or with OBGYN first. Can try Flonase nasal spray for 1-2 weeks. If no better, may need reevaluation.     Follow-up plan: Return in about 1 week (around 09/21/2016) for recheck if no better .  Visit summary with medication list and pertinent instructions was printed for patient to review, alert [place] if any changes needed. All questions at time of visit were answered - patient instructed to contact office  with any additional concerns. ER/RTC precautions were reviewed with the patient and understanding verbalized.   Note: Total time spent 40 minutes, greater than 50% of the visit was spent face-to-face counseling and coordinating care for the following: The primary encounter diagnosis was Sinus tachycardia by electrocardiogram. Diagnoses of Tachycardia and Dysfunction of left eustachian tube were also  pertinent to this visit..    Addendum: 09/15/2016 at 7:20 AM   Lab results available indicate likely urinary tract infection but there is some concern for slightly elevated liver enzyme, protein in the urine. White blood cells are slightly elevated but platelets are okay. See results as below. Will forward this note to OB/GYN, see lab results for full details. I sent in Keflex to her pharmacy. We will see if we are able to add on a urine culture but may need to re-collect sample.  Results for orders placed or performed in visit on 09/14/16 (from the past 48 hour(s))  CBC     Status: Abnormal   Collection Time: 09/14/16  4:43 PM  Result Value Ref Range   WBC 11.4 (H) 3.8 - 10.8 K/uL   RBC 4.24 3.80 - 5.10 MIL/uL   Hemoglobin 14.1 11.7 - 15.5 g/dL   HCT 40.9 81.1 - 91.4 %   MCV 100.2 (H) 80.0 - 100.0 fL   MCH 33.3 (H) 27.0 - 33.0 pg   MCHC 33.2 32.0 - 36.0 g/dL   RDW 78.2 95.6 - 21.3 %   Platelets 311 140 - 400 K/uL   MPV 11.1 7.5 - 12.5 fL  COMPLETE METABOLIC PANEL WITH GFR     Status: Abnormal   Collection Time: 09/14/16  4:43 PM  Result Value Ref Range   Sodium 134 (L) 135 - 146 mmol/L   Potassium 3.8 3.5 - 5.3 mmol/L   Chloride 106 98 - 110 mmol/L   CO2 17 (L) 20 - 31 mmol/L   Glucose, Bld 75 65 - 99 mg/dL   BUN 7 7 - 25 mg/dL   Creat 0.86 5.78 - 4.69 mg/dL   Total Bilirubin 0.6 0.2 - 1.2 mg/dL   Alkaline Phosphatase 112 33 - 115 U/L   AST 26 10 - 30 U/L   ALT 39 (H) 6 - 29 U/L   Total Protein 6.0 (L) 6.1 - 8.1 g/dL   Albumin 3.5 (L) 3.6 - 5.1 g/dL   Calcium 8.9 8.6 - 62.9 mg/dL   GFR, Est African American >89 >=60 mL/min   GFR, Est Non African American >89 >=60 mL/min  TSH     Status: None   Collection Time: 09/14/16  4:43 PM  Result Value Ref Range   TSH 1.24 mIU/L    Comment:   Reference Range   > or = 20 Years  0.40-4.50   Pregnancy Range First trimester  0.26-2.66 Second trimester 0.55-2.73 Third trimester  0.43-2.91     Urinalysis, Routine w reflex  microscopic     Status: Abnormal   Collection Time: 09/14/16  4:43 PM  Result Value Ref Range   Color, Urine ORANGE (A) YELLOW    Comment: Biochemicals may be affected by the color of the urine.   APPearance CLOUDY (A) CLEAR   Specific Gravity, Urine 1.030 1.001 - 1.035   pH 6.0 5.0 - 8.0   Glucose, UA NEGATIVE NEGATIVE   Bilirubin Urine NEGATIVE NEGATIVE    Comment: Verified by repeat analysis.   Ketones, ur TRACE (A) NEGATIVE   Hgb urine dipstick NEGATIVE NEGATIVE   Protein, ur  1+ (A) NEGATIVE   Nitrite NEGATIVE NEGATIVE   Leukocytes, UA 1+ (A) NEGATIVE  Urine Microscopic     Status: Abnormal   Collection Time: 09/14/16  4:43 PM  Result Value Ref Range   WBC, UA 6-10 (A) <=5 WBC/HPF   RBC / HPF 0-2 <=2 RBC/HPF   Squamous Epithelial / LPF 0-5 <=5 HPF   Bacteria, UA MODERATE (A) NONE SEEN HPF   Crystals See Below (A) NONE SEEN HPF    Comment: Calcium Oxalate Crystals             FEW       ABN   NONE SEEN     HPF Amorphous Sediment                   FEW       ABN   NONE SEEN     HPF    Casts NONE SEEN NONE SEEN LPF   Yeast NONE SEEN NONE SEEN HPF

## 2016-09-14 NOTE — Patient Instructions (Signed)
Plan:  If chest pain, dizziness, lightheaded, short of breath, vaginal bleeding, vision changes, pain or any other concerning symptoms - TO HOSPITAL!  Please discuss your symptoms with your OBGYN team!   For ears - avoid over-the-counter medications unless discuss with me or with OBGYN first. Can try Flonase nasal spray for 1-2 weeks. If no better, may need reevaluation.

## 2016-09-15 LAB — URINALYSIS, MICROSCOPIC ONLY
CASTS: NONE SEEN [LPF]
Yeast: NONE SEEN [HPF]

## 2016-09-15 LAB — URINALYSIS, ROUTINE W REFLEX MICROSCOPIC
Bilirubin Urine: NEGATIVE
Glucose, UA: NEGATIVE
Hgb urine dipstick: NEGATIVE
NITRITE: NEGATIVE
PH: 6 (ref 5.0–8.0)
SPECIFIC GRAVITY, URINE: 1.03 (ref 1.001–1.035)

## 2016-09-15 LAB — TSH: TSH: 1.24 mIU/L

## 2016-09-15 MED ORDER — CEPHALEXIN 500 MG PO CAPS: 500.0000 mg | ORAL_CAPSULE | Freq: Three times a day (TID) | ORAL | 0 refills | Status: DC

## 2016-09-17 ENCOUNTER — Other Ambulatory Visit: Payer: Self-pay | Admitting: Osteopathic Medicine

## 2016-09-17 DIAGNOSIS — E538 Deficiency of other specified B group vitamins: Secondary | ICD-10-CM

## 2016-09-25 LAB — DRUG ABUSE PANEL 10-50, U
AMPHETAMINE: POSITIVE — AB
AMPHETAMINES (1000 ng/mL SCRN): POSITIVE — AB
BARBITURATES: NEGATIVE
BENZODIAZEPINES: NEGATIVE
COCAINE METABOLITES: NEGATIVE
MARIJUANA MET (50 ng/mL SCRN): NEGATIVE
METHADONE: NEGATIVE
METHAQUALONE: NEGATIVE
OPIATES: NEGATIVE
PHENCYCLIDINE: NEGATIVE
PROPOXYPHENE: NEGATIVE

## 2016-09-27 ENCOUNTER — Ambulatory Visit (INDEPENDENT_AMBULATORY_CARE_PROVIDER_SITE_OTHER): Payer: 59 | Admitting: Psychiatry

## 2016-09-27 ENCOUNTER — Encounter (HOSPITAL_COMMUNITY): Payer: Self-pay | Admitting: Psychiatry

## 2016-09-27 VITALS — BP 122/74 | HR 84 | Resp 16 | Ht 62.0 in | Wt 242.0 lb

## 2016-09-27 DIAGNOSIS — F0630 Mood disorder due to known physiological condition, unspecified: Secondary | ICD-10-CM | POA: Diagnosis not present

## 2016-09-27 DIAGNOSIS — Z8659 Personal history of other mental and behavioral disorders: Secondary | ICD-10-CM

## 2016-09-27 DIAGNOSIS — F4310 Post-traumatic stress disorder, unspecified: Secondary | ICD-10-CM | POA: Diagnosis not present

## 2016-09-27 DIAGNOSIS — F411 Generalized anxiety disorder: Secondary | ICD-10-CM

## 2016-09-27 DIAGNOSIS — Z811 Family history of alcohol abuse and dependence: Secondary | ICD-10-CM

## 2016-09-27 DIAGNOSIS — Z818 Family history of other mental and behavioral disorders: Secondary | ICD-10-CM | POA: Diagnosis not present

## 2016-09-27 DIAGNOSIS — G4700 Insomnia, unspecified: Secondary | ICD-10-CM

## 2016-09-27 DIAGNOSIS — F1099 Alcohol use, unspecified with unspecified alcohol-induced disorder: Secondary | ICD-10-CM

## 2016-09-27 MED ORDER — FLUOXETINE HCL 20 MG PO TABS: 30.0000 mg | ORAL_TABLET | Freq: Every day | ORAL | 1 refills | Status: DC

## 2016-09-27 NOTE — Progress Notes (Signed)
[facility] Outpatient Follow up visit  Patient Identification: Dorothy Marquez MRN:  [id number] Date of Evaluation:  09/27/2016 Referral Source: Primary care Chief Complaint:   Chief Complaint    Follow-up     Visit Diagnosis:  Major depression. GAD History of Present Illness:  31 years old currently single Caucasian female living with her boyfriend and one son 82 years of age. Referred initially  for management of depression and mood swings  Patient is 8months pregnant. Continue to work. Less sad , tolerating medications. Good support system Sleep is fair  Denies drug use Denies flashbacks from past   Modifying factor: relationship Severity of depression: not worse  Past Medical History:  Past Medical History:  Diagnosis Date  . Anxiety   . Depression   . History of hiatal hernia   . History of kidney stones    x5 episodes    Past Surgical History:  Procedure Laterality Date  . LAPAROSCOPIC GASTRIC SLEEVE RESECTION WITH HIATAL HERNIA REPAIR N/A 08/25/2014   Procedure: LAPAROSCOPIC GASTRIC SLEEVE RESECTION WITH HIATAL HERNIA REPAIR;  Surgeon: Gaynelle Adu, MD;  Location: [facility];  Service: General;  Laterality: N/A;  . UPPER GI ENDOSCOPY N/A 08/25/2014   Procedure: UPPER GI ENDOSCOPY;  Surgeon: Gaynelle Adu, MD;  Location: [facility];  Service: General;  Laterality: N/A;  . WISDOM TOOTH EXTRACTION        Family History:  Family History  Problem Relation Age of Onset  . Depression Mother   . Diabetes Mother   . Depression Brother   . Hyperlipidemia Brother   . Diabetes Maternal Aunt   . Depression Maternal Aunt   . Alcohol abuse Maternal Uncle     Social History:   Social History   Social History  . Marital status: Single    Spouse name: N/A  . Number of children: N/A  . Years of education: N/A   Social History Main Topics  . Smoking status: Never Smoker  . Smokeless tobacco: Never Used  . Alcohol use Yes     Comment: rare- social  . Drug use: No  . Sexual  activity: Yes   Other Topics Concern  . None   Social History Narrative  . None    Allergies:  No Known Allergies  Metabolic Disorder Labs: Lab Results  Component Value Date   HGBA1C 5.1 01/22/2014   MPG 100 01/22/2014   No results found for: PROLACTIN Lab Results  Component Value Date   CHOL 187 05/18/2015   TRIG 71 05/18/2015   HDL 43 (L) 05/18/2015   CHOLHDL 4.3 05/18/2015   VLDL 14 05/18/2015   LDLCALC 130 (H) 05/18/2015   LDLCALC 108 (H) 01/22/2014     Current Medications: Current Outpatient Prescriptions  Medication Sig Dispense Refill  . cephALEXin (KEFLEX) 500 MG capsule Take 1 capsule (500 mg total) by mouth 3 (three) times daily. For 7 days for urinary tract infection 21 capsule 0  . FLUoxetine (PROZAC) 20 MG tablet Take 1.5 tablets (30 mg total) by mouth daily. 45 tablet 1  . folic acid (FOLVITE) 1 MG tablet TAKE 1 TABLET BY MOUTH  DAILY 90 tablet 2  . Melatonin 5 MG CAPS Take by mouth.    . Prenatal Multivit-Min-Fe-FA (PRE-NATAL PO) Take by mouth.    . Vitamin D, Ergocalciferol, (DRISDOL) 50000 units CAPS capsule Take 1 capsule (50,000 Units total) by mouth every 7 (seven) days. Take for 8 total doses(weeks) 12 capsule 0   No current facility-administered medications for  this visit.       Psychiatric Specialty Exam: Review of Systems  Constitutional: Negative for fever.  Cardiovascular: Negative for palpitations.  Skin: Negative for itching.  Neurological: Negative for tingling.  Psychiatric/Behavioral: Negative for depression and suicidal ideas.    Blood pressure 122/74, pulse 84, resp. rate 16, height 5\' 2"  (1.575 m), weight 242 lb (109.8 kg), last menstrual period 01/22/2016, SpO2 97 %.Body mass index is 44.26 kg/m.  General Appearance: Casual  Eye Contact:  Good  Speech:  Clear and Coherent  Volume:  Decreased  Mood: less dysphoric  Affect:  congruent  Thought Process:  Goal Directed  Orientation:  Full (Time, Place, and Person)  Thought  Content:  Rumination  Suicidal Thoughts:  No  Homicidal Thoughts:  No  Memory:  Immediate;   Fair Recent;   Fair  Judgement:  Fair  Insight:  Fair  Psychomotor Activity:  Normal  Concentration:  Concentration: Fair and Attention Span: Fair  Recall:  [employer]Fair  Fund of Knowledge:Fair  Language: Fair  Akathisia:  Negative  Handed:  Right  AIMS (if indicated):    Assets:  Desire for Improvement  ADL's:  Intact  Cognition: WNL  Sleep:  Variable to poor    Treatment Plan Summary: Medication management and Plan as follows  1. Mood disorder/ Depressio: Less dysphoric, better days as well. Continue prozac 30mg   Patient OB/GYN is aware of her meds  2. GAD: baseline 'continue prozac   3. Insomnia: not worse  PTSD: denies disturbing flashbacks.  Focus on having the baby. Provided supportive therapy. Reviewed questions and addressed FU 2 months. Patient understands minimal amount may go breast feeding and effect baby or its behaviour.       Thresa RossAKHTAR, Xela Oregel, MD 7/24/20184:12 PM

## 2016-11-06 DIAGNOSIS — I619 Nontraumatic intracerebral hemorrhage, unspecified: Secondary | ICD-10-CM | POA: Insufficient documentation

## 2016-11-06 HISTORY — DX: Nontraumatic intracerebral hemorrhage, unspecified: I61.9

## 2016-11-07 DIAGNOSIS — E7800 Pure hypercholesterolemia, unspecified: Secondary | ICD-10-CM | POA: Insufficient documentation

## 2016-11-07 DIAGNOSIS — R1310 Dysphagia, unspecified: Secondary | ICD-10-CM | POA: Insufficient documentation

## 2016-11-11 DIAGNOSIS — I610 Nontraumatic intracerebral hemorrhage in hemisphere, subcortical: Secondary | ICD-10-CM | POA: Insufficient documentation

## 2016-11-11 HISTORY — DX: Nontraumatic intracerebral hemorrhage in hemisphere, subcortical: I61.0

## 2016-11-22 ENCOUNTER — Ambulatory Visit (HOSPITAL_COMMUNITY): Payer: Self-pay | Admitting: Psychiatry

## 2016-12-15 ENCOUNTER — Encounter: Payer: Self-pay | Admitting: Osteopathic Medicine

## 2016-12-15 DIAGNOSIS — F1510 Other stimulant abuse, uncomplicated: Secondary | ICD-10-CM | POA: Insufficient documentation

## 2016-12-19 ENCOUNTER — Inpatient Hospital Stay: Payer: Self-pay | Admitting: Osteopathic Medicine

## 2017-01-10 ENCOUNTER — Telehealth: Payer: Self-pay

## 2017-01-10 NOTE — Telephone Encounter (Signed)
Sarah with [facility] is faxing over an authorization for OT, PT and speech therapy. I was not sure if you would be ok with being the authorizing provider.

## 2017-01-10 NOTE — Telephone Encounter (Signed)
I haven't seen her for HFU from problem 11/2016, so she definitely needs appt but I am ok to sign off on the forms

## 2017-01-12 NOTE — Telephone Encounter (Signed)
Left message for a return call to schedule a follow up hospital visit.

## 2017-02-10 ENCOUNTER — Telehealth: Payer: Self-pay | Admitting: Osteopathic Medicine

## 2017-02-10 NOTE — Telephone Encounter (Signed)
[facility] for OT 2x week for 2 weeks, then 1x week for 2 weeks.   Verbal given.

## 2017-03-14 ENCOUNTER — Ambulatory Visit (INDEPENDENT_AMBULATORY_CARE_PROVIDER_SITE_OTHER): Payer: 59 | Admitting: Osteopathic Medicine

## 2017-03-14 ENCOUNTER — Encounter: Payer: Self-pay | Admitting: Osteopathic Medicine

## 2017-03-14 VITALS — BP 117/75 | HR 96 | Temp 98.0°F | Wt 224.7 lb

## 2017-03-14 DIAGNOSIS — I6389 Other cerebral infarction: Secondary | ICD-10-CM | POA: Diagnosis not present

## 2017-03-14 DIAGNOSIS — N3000 Acute cystitis without hematuria: Secondary | ICD-10-CM | POA: Diagnosis not present

## 2017-03-14 DIAGNOSIS — R350 Frequency of micturition: Secondary | ICD-10-CM | POA: Diagnosis not present

## 2017-03-14 DIAGNOSIS — F331 Major depressive disorder, recurrent, moderate: Secondary | ICD-10-CM

## 2017-03-14 DIAGNOSIS — B9689 Other specified bacterial agents as the cause of diseases classified elsewhere: Secondary | ICD-10-CM | POA: Diagnosis not present

## 2017-03-14 DIAGNOSIS — N760 Acute vaginitis: Secondary | ICD-10-CM

## 2017-03-14 LAB — POCT URINALYSIS DIPSTICK
BILIRUBIN UA: NEGATIVE
Blood, UA: NEGATIVE
GLUCOSE UA: NEGATIVE
Nitrite, UA: NEGATIVE
Protein, UA: NEGATIVE
Spec Grav, UA: 1.02 (ref 1.010–1.025)
Urobilinogen, UA: 1 E.U./dL
pH, UA: 5.5 (ref 5.0–8.0)

## 2017-03-14 MED ORDER — SULFAMETHOXAZOLE-TRIMETHOPRIM 800-160 MG PO TABS
1.0000 | ORAL_TABLET | Freq: Two times a day (BID) | ORAL | 0 refills | Status: DC
Start: 2017-03-14 — End: 2017-04-11

## 2017-03-14 MED ORDER — METRONIDAZOLE 500 MG PO TABS: 500.0000 mg | ORAL_TABLET | Freq: Two times a day (BID) | ORAL | 0 refills | Status: AC

## 2017-03-14 MED ORDER — FLUOXETINE HCL 20 MG PO TABS: 20.0000 mg | ORAL_TABLET | Freq: Every day | ORAL | 1 refills | Status: DC

## 2017-03-14 NOTE — Progress Notes (Signed)
HPI: Dorothy Marquez is a 32 y.o. female who  has a past medical history of Anxiety, Depression, History of hiatal hernia, and History of kidney stones.  she presents to [facility] today, 03/14/17,  for chief complaint of:  Chief Complaint  Patient presents with  . Follow-up - Stroke 11/2016    Admitted 11/06/2016, discharged 11/15/2016 for sub-cortical hemorrhage of right cerebral hemisphere, her discharge diagnoses also include hypertension, objective sleep apnea, BMI 50-59, depression, amphetamine abuse, dysphagia, recent normal labor and delivery 10/24/16.   Was admitted to the neuroscience intensive care unit. Earlier in the day she had developed a headache and took an aspirin then while eating dinner she was feeling weak and went to the emergency department. CT demonstrated hemorrhage with midline shift.. Surgical intervention was not required as she was improving. Did not require any antihypertensive treatment during her hospitalization. Transferred to neuro medical floor 11/10/2016. Was discharged to inpatient rehabilitation 11/15/2016 and discharged to home 12/09/2016.  She has not had follow-up with a neurologist since her discharge from rehabilitation. She states she was not told to follow-up with the neurologist, was also unaware of follow-up appointment scheduled with me back in October.  She complains of persistent left lower and upper extremity weakness, trouble with fine motor movement on the left hand, trouble with balance particularly with the left leg, has some left facial droop and some slurred speech.  Complaining of urinary frequency and middle lower back pain past few days.   Having visual disturbances since the stroke. Low energy and reaction time. Wants thyroid tested but she is unable to get labs done right now due to financial issues.  Concerns with depression: She has been off of the Prozac since discharge from rehabilitation. She  would like to get back on this medication. She is suffering a lot of stress from her condition, she wants to get back to work and care for her baby but she has a lot of trouble doing so; she has passive death wish but no suicidal ideation  Patient is accompanied by mom who assists with history-taking.   Past medical, surgical, social and family history reviewed:  Patient Active Problem List   Diagnosis Date Noted  . 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 Pregnancy 03/03/2016  . PTSD (post-traumatic stress disorder) 03/03/2016  . Episodic tension-type headache, not intractable 10/08/2015  . Mild episode of recurrent major depressive disorder (HCC) 05/25/2015  . GAD (generalized anxiety disorder) 05/25/2015  . Mood disorder (HCC) 05/25/2015  . Inattention 05/25/2015  . Need for prophylactic vaccination and inoculation against influenza 05/25/2015  . History of gastric bypass 05/25/2015  . Vitamin D deficiency 05/25/2015  . Folate deficiency 05/25/2015  . Thiamine deficiency 05/25/2015  . Mild obstructive sleep apnea 08/28/2014  . Dyslipidemia 08/26/2014  . Elevated blood pressure 08/26/2014  . Morbid obesity with BMI of 60.0-69.9, adult (HCC) 08/26/2014  . 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 Essential (primary) hypertension 08/08/2013  . Clinical depression 05/30/2013  . HLD (hyperlipidemia) 08/28/2012  . Obstructive apnea 08/28/2012    Past Surgical History:  Procedure Laterality Date  . LAPAROSCOPIC GASTRIC SLEEVE RESECTION WITH HIATAL HERNIA REPAIR N/A 08/25/2014   Procedure: LAPAROSCOPIC GASTRIC SLEEVE RESECTION WITH HIATAL HERNIA REPAIR;  Surgeon: Gaynelle Adu, MD;  Location: [facility];  Service: General;  Laterality: N/A;  . UPPER GI ENDOSCOPY N/A 08/25/2014   Procedure: UPPER GI ENDOSCOPY;  Surgeon: Gaynelle Adu, MD;  Location: [facility];  Service: General;  Laterality:  N/A;  . WISDOM TOOTH EXTRACTION      Social History   Tobacco Use  . Smoking status: Never  Smoker  . Smokeless tobacco: Never Used  Substance Use Topics  . Alcohol use: Yes    Comment: rare- social    Family History  Problem Relation Age of Onset  . Depression Mother   . Diabetes Mother   . Depression Brother   . Hyperlipidemia Brother   . Diabetes Maternal Aunt   . Depression Maternal Aunt   . Alcohol abuse Maternal Uncle      Current medication list and allergy/intolerance information reviewed:    No current outpatient medications on file.   No current facility-administered medications for this visit.     No Known Allergies    Review of Systems:  Constitutional:  No  fever, no chills, No recent illness,  HEENT: No  headache  Cardiac: No  chest pain, No  pressure, No palpitations  Respiratory:  No  shortness of breath. No  Cough  Gastrointestinal: No  abdominal pain, No  nausea  Musculoskeletal: No new myalgia/arthralgia  Genitourinary: No  incontinence, No  abnormal genital bleeding, +abnormal genital discharge  Skin: No  Rash  Endocrine: No cold intolerance,  No heat intolerance. No polyuria/polydipsia/polyphagia   Neurologic: +L sided weakness, No  dizziness  Psychiatric: +concerns with depression, No  concerns with anxiety, No sleep problems, No mood problems  Exam:  BP 117/75   Pulse 96   Temp 98 F (36.7 C) (Oral)   Wt 224 lb 11.2 oz (101.9 kg)   BMI 41.10 kg/m   Constitutional: VS see above. General Appearance: alert, well-developed, well-nourished, NAD  Eyes: Normal lids and conjunctive, non-icteric sclera  Ears, Nose, Mouth, Throat: MMM, Normal external inspection ears/nares/mouth/lips/gums except facial droop on left side  Neck: No masses, trachea midline. No thyroid enlargement. No tenderness/mass appreciated. No lymphadenopathy  Respiratory: Normal respiratory effort. no wheeze, no rhonchi, no rales  Cardiovascular: S1/S2 normal, no murmur, no rub/gallop auscultated. RRR. No lower extremity edema. .   Musculoskeletal:  Gait unsteady, strength 5 out of 5 in all 4 extremities but somewhat diminished on left especially to hip flexion  Neurological: Normal balance/coordination. No tremor. No cranial nerve deficit on limited exam. sensation intact and symmetric. Cerebellar reflexes intact.   Skin: warm, dry, intact. No rash/ulcer. No concerning nevi or subq nodules on limited exam.    Psychiatric: Normal judgment/insight. Depressed mood and affect. Oriented x3.    Results for orders placed or performed in visit on 03/14/17 (from the past 72 hour(s))  POCT Urinalysis Dipstick     Status: Abnormal   Collection Time: 03/14/17  3:31 PM  Result Value Ref Range   Color, UA DARK YELLOW    Clarity, UA SLIGHTLY CLOUDY    Glucose, UA NEGATIVE    Bilirubin, UA NEGATIVE    Ketones, UA TRACE    Spec Grav, UA 1.020 1.010 - 1.025   Blood, UA NEGATIVE    pH, UA 5.5 5.0 - 8.0   Protein, UA NEGATIVE    Urobilinogen, UA 1.0 0.2 or 1.0 E.U./dL   Nitrite, UA NEGATIVE    Leukocytes, UA Trace (A) Negative   Appearance     Odor      No results found.   ASSESSMENT/PLAN: Persistent deficits include left-sided weakness, speech difficulty, vision impairment. She was participating in rehabilitation but has been unable to do so since the new year due to insurance issues. Stroke certainly may have been  attributed to amphetamine use of the posterior underlying organic pathology, so would like neurology opinion regarding secondary prevention, whether statin would be worth it or just close blood pressure control and avoidance of stimulants.  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 All questions at time of visit were answered - patient instructed to contact office with any additional concerns. ER/RTC precautions were reviewed with the patient.   Follow-up plan: Return in about 4 weeks (around 04/11/2017) for recheck on fluoxetine and recovery from stroke .  Note: Total time spent 40 minutes, greater than 50% of the visit was spent face-to-face counseling and coordinating care for the following: The primary encounter diagnosis was Cerebrovascular accident (CVA) due to other mechanism. Diagnoses of Urine frequency, Bacterial vaginosis, and Acute cystitis without hematuria were also pertinent to this visit.Marland Kitchen this includes filling out paperwork for disability. Original copies were provided to the patient and her mother, copies were kept here for [place] to send along with relevant notes   Please note: voice recognition software was used to produce this document, and typos may escape review. Please contact Dr. Lyn Hollingshead for any needed clarifications.

## 2017-03-16 LAB — URINE CULTURE
MICRO NUMBER: 90029576
SPECIMEN QUALITY:: ADEQUATE

## 2017-03-27 ENCOUNTER — Encounter (HOSPITAL_COMMUNITY): Payer: Self-pay

## 2017-04-11 ENCOUNTER — Ambulatory Visit (INDEPENDENT_AMBULATORY_CARE_PROVIDER_SITE_OTHER): Payer: 59 | Admitting: Osteopathic Medicine

## 2017-04-11 ENCOUNTER — Encounter: Payer: Self-pay | Admitting: Osteopathic Medicine

## 2017-04-11 VITALS — BP 114/70 | HR 69 | Temp 98.0°F | Wt 227.0 lb

## 2017-04-11 DIAGNOSIS — F331 Major depressive disorder, recurrent, moderate: Secondary | ICD-10-CM | POA: Diagnosis not present

## 2017-04-11 MED ORDER — FLUOXETINE HCL 40 MG PO CAPS: 40.0000 mg | ORAL_CAPSULE | Freq: Every day | ORAL | 1 refills | Status: DC

## 2017-04-11 NOTE — Patient Instructions (Signed)
Need:  Juror #  Date requested that you come in to serve

## 2017-04-11 NOTE — Progress Notes (Signed)
HPI: Dorothy Marquez is a 32 y.o. female who  has a past medical history of Anxiety, Depression, History of hiatal hernia, and History of kidney stones.  she presents to [facility] today, 04/11/17,  for chief complaint of:  Chief Complaint  Patient presents with  . Follow-up - Depression     CVA: Admitted 11/06/2016, discharged 11/15/2016 for sub-cortical hemorrhage of right cerebral hemisphere, her discharge diagnoses also include hypertension, objective sleep apnea, BMI 50-59, depression, amphetamine abuse, dysphagia, recent normal labor and delivery 10/24/16. She has not had follow-up with a neurologist since her discharge from rehabilitation. We placed referral last viist.   Concerns with depression - worse since stroke: She has been off of the Prozac since discharge from rehabilitation, we restarted that medication last visit and she is here to follow-up on this. She was suffering a lot of stress from her condition, she wants to get back to work and care for her baby but she has a lot of trouble doing so; she had passive death wish but no suicidal ideation. Self-reporting of symptoms has improved - see PHQ9 as below. She is feeling better but thinks may be some room for improvement    Depression screen [facility] 2/9 04/11/2017 03/14/2017 06/15/2015  Decreased Interest 2 3 2   Down, Depressed, Hopeless 2 3 2   PHQ - 2 Score 4 6 4   Altered sleeping 1 3 3   Tired, decreased energy 3 3 2   Change in appetite 2 3 3   Feeling bad or failure about yourself  0 3 2  Trouble concentrating 2 3 3   Moving slowly or fidgety/restless 1 3 2   Suicidal thoughts 0 1 1  PHQ-9 Score 13 25 20   Difficult doing work/chores - Not difficult at all Very difficult   GAD 7 : Generalized Anxiety Score 04/11/2017 06/15/2015  Nervous, Anxious, on Edge 2 3  Control/stop worrying 1 2  Worry too much - different things 2 2  Trouble relaxing 1 3  Restless 2 2  Easily annoyed or irritable 2 3   Afraid - awful might happen 0 2  Total GAD 7 Score 10 17  Anxiety Difficulty - Extremely difficult      Patient is accompanied by mom who assists with history-taking.   Past medical, surgical, social and family history reviewed:  Patient Active Problem List   Diagnosis Date Noted  . 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 Dysphagia 11/07/2016  . Intraparenchymal hemorrhage of brain (HCC) 11/06/2016  . Pregnancy 03/03/2016  . PTSD (post-traumatic stress disorder) 03/03/2016  . Episodic tension-type headache, not intractable 10/08/2015  . Mild episode of recurrent major depressive disorder (HCC) 05/25/2015  . GAD (generalized anxiety disorder) 05/25/2015  . Mood disorder (HCC) 05/25/2015  . Inattention 05/25/2015  . Need for prophylactic vaccination and inoculation against influenza 05/25/2015  . History of gastric bypass 05/25/2015  . Vitamin D deficiency 05/25/2015  . Folate deficiency 05/25/2015  . Thiamine deficiency 05/25/2015  . Mild obstructive sleep apnea 08/28/2014  . Dyslipidemia 08/26/2014  . Elevated blood pressure 08/26/2014  . Morbid obesity with BMI of 60.0-69.9, adult (HCC) 08/26/2014  . 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 Essential (primary) hypertension 08/08/2013  . Clinical depression 05/30/2013  . HLD (hyperlipidemia) 08/28/2012  . Obstructive apnea 08/28/2012    Past Surgical History:  Procedure Laterality Date  . LAPAROSCOPIC GASTRIC SLEEVE RESECTION WITH HIATAL HERNIA REPAIR N/A 08/25/2014   Procedure:  LAPAROSCOPIC GASTRIC SLEEVE RESECTION WITH HIATAL HERNIA REPAIR;  Surgeon: Gaynelle Adu, MD;  Location: [facility];  Service: General;  Laterality: N/A;  . UPPER GI ENDOSCOPY N/A 08/25/2014   Procedure: UPPER GI ENDOSCOPY;  Surgeon: Gaynelle Adu, MD;  Location: [facility];  Service: General;  Laterality: N/A;  . WISDOM TOOTH EXTRACTION      Social History    Tobacco Use  . Smoking status: Never Smoker  . Smokeless tobacco: Never Used  Substance Use Topics  . Alcohol use: Yes    Comment: rare- social    Family History  Problem Relation Age of Onset  . Depression Mother   . Diabetes Mother   . Depression Brother   . Hyperlipidemia Brother   . Diabetes Maternal Aunt   . Depression Maternal Aunt   . Alcohol abuse Maternal Uncle      Current medication list and allergy/intolerance information reviewed:    Current Outpatient Medications  Medication Sig Dispense Refill  . FLUoxetine (PROZAC) 20 MG tablet Take 1 tablet (20 mg total) by mouth daily. 90 tablet 1  . sulfamethoxazole-trimethoprim (BACTRIM DS) 800-160 MG tablet Take 1 tablet by mouth 2 (two) times daily. For UTI (Patient not taking: Reported on 04/11/2017) 6 tablet 0   No current facility-administered medications for this visit.     Allergies  Allergen Reactions  . Iodinated Diagnostic Agents Hives  . Other       Review of Systems:  Constitutional:  No  fever, no chills, No recent illness,  HEENT: No  headache  Musculoskeletal: No new myalgia/arthralgia  Skin: No  Rash  Endocrine: No cold intolerance,  No heat intolerance. No polyuria/polydipsia/polyphagia   Neurologic: +L sided weakness improving a little bit, No  dizziness  Psychiatric: +concerns with depression, No  concerns with anxiety, No sleep problems, No mood problems  Exam:  BP 114/70   Pulse 69   Temp 98 F (36.7 C) (Oral)   Wt 227 lb 0.6 oz (103 kg)   LMP 03/24/2017   BMI 41.53 kg/m   Constitutional: VS see above. General Appearance: alert, well-developed, well-nourished, NAD  Eyes: Normal lids and conjunctive, non-icteric sclera  Ears, Nose, Mouth, Throat: MMM, Normal external inspection ears/nares/mouth/lips/gums except facial droop on left side  Neck: No masses, trachea midline  Respiratory: Normal respiratory effort  Musculoskeletal: Gait unsteady,  Psychiatric: Normal  judgment/insight. Normal mood and affect. Oriented x3.      ASSESSMENT/PLAN:   Depression doing a lot better but feels there is some room for improvement. Will increase Prozac to 40 mg and reevaluate as needed, definitely check back in in 6 mos   S/p CVA ?d/t amphetamine use - Persistent deficits include left-sided weakness, speech difficulty, vision impairment. She was participating in rehabilitation but has been unable to do so since the new year due to insurance issues. Stroke certainly may have been attributed to amphetamine use or other underlying organic pathology, so would like neurology opinion regarding secondary prevention, whether statin would be worth it or just close blood pressure control and avoidance of stimulants. Has appt w/ neuro 04/19/17  Requests letter for jury duty excuse, I need juror number and dates    Moderate episode of recurrent major depressive disorder (HCC)   Meds ordered this encounter  Medications  . FLUoxetine (PROZAC) 40 MG capsule    Sig: Take 1 capsule (40 mg total) by mouth daily.    Dispense:  90 capsule    Refill:  1  Visit summary with medication list and pertinent instructions was printed for patient to review. All questions at time of visit were answered - patient instructed to contact office with any additional concerns. ER/RTC precautions were reviewed with the patient.   Follow-up plan: Return in about 6 months (around 10/09/2017) for routine recheck from stroke and checkup on prozac - sooner if needed .  Note: Total time spent 25 minutes, greater than 50% of the visit was spent face-to-face counseling and coordinating care for the following: The encounter diagnosis was Moderate episode of recurrent major depressive disorder (HCC).  Please note: voice recognition software was used to produce this document, and typos may escape review. Please contact Dr. Lyn HollingsheadAlexander for any needed clarifications.

## 2017-05-23 ENCOUNTER — Other Ambulatory Visit: Payer: Self-pay | Admitting: Osteopathic Medicine

## 2017-10-09 ENCOUNTER — Ambulatory Visit: Payer: Self-pay | Admitting: Osteopathic Medicine

## 2018-03-07 NOTE — L&D Delivery Note (Signed)
OB/GYN Faculty Practice Delivery Note  Dorothy Marquez is a 33 y.o. [id number] s/p SVD at [redacted]w[redacted]d. She was admitted for IOL secondary to IUGR.   ROM: AROM 1839 with clear fluid GBS Status:  Negative (08/18 0000) Maximum Maternal Temperature: 98.40F  Labor Progress: . Patient presented to L&D for IOL secondary to IUGR. Initial SVE: 2.5/50%/-3 and Cytotec was placed. Patient then received Pitocin and an Epidural. AROM and then FSE was placed due to difficulty tracing FHR. She then progressed to complete.   Delivery Date/Time: 8/21 at [facility] Delivery: Called to room and patient was complete and pushing. Head delivered in LOA position. No nuchal cord present. Shoulder and body delivered in usual fashion. Infant with spontaneous cry, placed on mother's abdomen, dried and stimulated. Cord clamped x 2 shortly after delivery and infant went to warmer and was quickly able to return to mother's abdomen. Cord blood drawn. Placenta delivered spontaneously with gentle cord traction. Fundus firm with massage and Pitocin. Labia, perineum, vagina, and cervix inspected inspected with no lacerations.   Placenta: Sent to L&D Complications: None Lacerations: None EBL: 100 mL Analgesia: Epidural    Postpartum Planning [ ]  message to sent to schedule follow-up  [ ]  vaccines UTD  Infant: APGAR (1 MIN):  7 APGAR (5 MINS):  [place], MD [facility] Family Medicine Fellow, [facility] for [facility] 10/26/2018, 7:39 PM

## 2018-03-29 ENCOUNTER — Ambulatory Visit (INDEPENDENT_AMBULATORY_CARE_PROVIDER_SITE_OTHER): Payer: 59 | Admitting: Osteopathic Medicine

## 2018-03-29 ENCOUNTER — Encounter: Payer: Self-pay | Admitting: Osteopathic Medicine

## 2018-03-29 VITALS — BP 108/76 | HR 88 | Temp 98.1°F | Wt 275.1 lb

## 2018-03-29 DIAGNOSIS — N3000 Acute cystitis without hematuria: Secondary | ICD-10-CM

## 2018-03-29 DIAGNOSIS — R300 Dysuria: Secondary | ICD-10-CM | POA: Diagnosis not present

## 2018-03-29 DIAGNOSIS — N390 Urinary tract infection, site not specified: Secondary | ICD-10-CM

## 2018-03-29 LAB — POCT URINALYSIS DIPSTICK
Blood, UA: NEGATIVE
GLUCOSE UA: NEGATIVE
Nitrite, UA: POSITIVE
Protein, UA: POSITIVE — AB
Spec Grav, UA: 1.02 (ref 1.010–1.025)
UROBILINOGEN UA: 4 U/dL — AB
pH, UA: 7 (ref 5.0–8.0)

## 2018-03-29 MED ORDER — CIPROFLOXACIN HCL 500 MG PO TABS: 500.0000 mg | ORAL_TABLET | Freq: Two times a day (BID) | ORAL | 0 refills | Status: DC

## 2018-03-29 MED ORDER — FLUOXETINE HCL 40 MG PO CAPS: 40.0000 mg | ORAL_CAPSULE | Freq: Every day | ORAL | 3 refills | Status: DC

## 2018-03-29 NOTE — Patient Instructions (Signed)

## 2018-03-29 NOTE — Progress Notes (Signed)
Chief Complaint: Possible UTI  History of Present Illness: Dorothy Marquez is a 33 y.o. female who presents to [facility]  today with concerns for UTI  Onset: 2 weeks ago  Quality: Burning/Urgency Associated Symptoms: see ROS below Context:  Previous UTI: ahilw ago, none recent  Recurrent UTI (3 times/more annually): no  Abx in past 3 months: no  Complicated (any of the following): yes ?Symptoms for seven or more days before seeking care  Requests refill Prozac       Past medical, social and family history reviewed: Past Medical History:  Diagnosis Date  . Anxiety   . Depression   . History of hiatal hernia   . History of kidney stones    x5 episodes   Past Surgical History:  Procedure Laterality Date  . LAPAROSCOPIC GASTRIC SLEEVE RESECTION WITH HIATAL HERNIA REPAIR N/A 08/25/2014   Procedure: LAPAROSCOPIC GASTRIC SLEEVE RESECTION WITH HIATAL HERNIA REPAIR;  Surgeon: Gaynelle Adu, MD;  Location: [facility];  Service: General;  Laterality: N/A;  . UPPER GI ENDOSCOPY N/A 08/25/2014   Procedure: UPPER GI ENDOSCOPY;  Surgeon: Gaynelle Adu, MD;  Location: [facility];  Service: General;  Laterality: N/A;  . WISDOM TOOTH EXTRACTION     Social History   Tobacco Use  . Smoking status: Never Smoker  . Smokeless tobacco: Never Used  Substance Use Topics  . Alcohol use: Yes    Comment: rare- social   The patient has a family history of  Current Outpatient Medications  Medication Sig Dispense Refill  . atorvastatin (LIPITOR) 40 MG tablet     . ciprofloxacin (CIPRO) 500 MG tablet Take 1 tablet (500 mg total) by mouth 2 (two) times daily. 20 tablet 0  . FLUoxetine (PROZAC) 40 MG capsule Take 1 capsule (40 mg total) by mouth daily. 90 capsule 3  . nortriptyline (PAMELOR) 50 MG capsule      No current facility-administered medications for this visit.    Allergies  Allergen Reactions  . Iodinated Diagnostic Agents Hives     Review of  Systems: CONSTITUTIONAL: Negative fever/chills CARDIAC: No chest pain/pressure/palpitations, no orthopnea RESPIRATORY: No cough/shortness of breath/wheeze GASTROINTESTINAL: No nausea/vomiting/abdominal pain/blood in stool/diarrhea/constipation MUSCULOSKELETAL: see below re: flank pain GENITOURINARY:    Frequency: yes  Hematuria: no  Odor: yes  Incontinence: no  Flank Pain: no  Vaginal bleeding/discharge: no   Exam:  BP 108/76 (BP Location: Left Arm, Patient Position: Sitting, Cuff Size: Normal)   Pulse 88   Temp 98.1 F (36.7 C) (Oral)   Wt 275 lb 1.6 oz (124.8 kg)   BMI 50.32 kg/m  Constitutional: VSS, see above. General Appearance: alert, well-developed, well-nourished, NAD Respiratory: Normal respiratory effort. Breath sounds normal, no wheeze/rhonchi/rales Cardiovascular: S1/S2 normal, no murmur/rub/gallop auscultated. RRR Gastrointestinal: Nontender, no masses. No hepatomegaly, no splenomegaly. No hernia appreciated. Rectal exam deferred.  Musculoskeletal: Gait normal. No clubbing/cyanosis of digits. Lloyd sign Negative bilateral  Results for orders placed or performed in visit on 03/29/18 (from the past 24 hour(s))  POCT Urinalysis Dipstick     Status: Abnormal   Collection Time: 03/29/18  3:51 PM  Result Value Ref Range   Color, UA amber    Clarity, UA cloudy    Glucose, UA Negative Negative   Bilirubin, UA small    Ketones, UA trace    Spec Grav, UA 1.020 1.010 - 1.025   Blood, UA negative    pH, UA 7.0 5.0 - 8.0   Protein, UA Positive (A) Negative  Urobilinogen, UA 4.0 (A) 0.2 or 1.0 E.U./dL   Nitrite, UA positive    Leukocytes, UA Small (1+) (A) Negative   Appearance     Odor      Previous Culture Results: no UTI   ASSESSMENT/PLAN:   Acute cystitis without hematuria  Dysuria - Plan: POCT Urinalysis Dipstick, Urinalysis, microscopic only, Urine Culture  Complicated UTI (urinary tract infection)  Meds ordered this encounter  Medications  .  ciprofloxacin (CIPRO) 500 MG tablet    Sig: Take 1 tablet (500 mg total) by mouth 2 (two) times daily.    Dispense:  20 tablet    Refill:  0  . FLUoxetine (PROZAC) 40 MG capsule    Sig: Take 1 capsule (40 mg total) by mouth daily.    Dispense:  90 capsule    Refill:  3     Patient advised we will call with urine culture results once available, depending on results may need to change therapy. Return for annual physical - sometime in the next 6 months .

## 2018-03-31 LAB — URINE CULTURE
MICRO NUMBER: 96542
SPECIMEN QUALITY:: ADEQUATE

## 2018-03-31 LAB — URINALYSIS, MICROSCOPIC ONLY

## 2018-04-03 ENCOUNTER — Telehealth: Payer: Self-pay

## 2018-04-03 MED ORDER — ONDANSETRON 8 MG PO TBDP: 8.0000 mg | ORAL_TABLET | Freq: Three times a day (TID) | ORAL | 3 refills | Status: DC | PRN

## 2018-04-03 NOTE — Telephone Encounter (Signed)
Left a detailed vm msg for regarding nausea med sent to the pharmacy. Direct call back info provided.

## 2018-04-03 NOTE — Telephone Encounter (Signed)
Pt left a vm msg stating that antibiotic for UTI makes her vomit. Requesting if provider can send another med or a rx to help with the nausea. Pls advise, thanks.

## 2018-04-03 NOTE — Telephone Encounter (Signed)
Zofran sent to [facility]

## 2018-06-06 ENCOUNTER — Encounter: Payer: Self-pay | Admitting: *Deleted

## 2018-06-07 ENCOUNTER — Other Ambulatory Visit: Payer: Self-pay

## 2018-06-07 ENCOUNTER — Other Ambulatory Visit (HOSPITAL_COMMUNITY): Payer: Self-pay | Admitting: Obstetrics & Gynecology

## 2018-06-07 ENCOUNTER — Encounter: Payer: Self-pay | Admitting: Obstetrics & Gynecology

## 2018-06-07 ENCOUNTER — Ambulatory Visit (INDEPENDENT_AMBULATORY_CARE_PROVIDER_SITE_OTHER): Payer: 59 | Admitting: Obstetrics & Gynecology

## 2018-06-07 VITALS — BP 117/78 | HR 96 | Temp 97.3°F | Wt 260.0 lb

## 2018-06-07 DIAGNOSIS — Z9884 Bariatric surgery status: Secondary | ICD-10-CM

## 2018-06-07 DIAGNOSIS — Z113 Encounter for screening for infections with a predominantly sexual mode of transmission: Secondary | ICD-10-CM | POA: Diagnosis not present

## 2018-06-07 DIAGNOSIS — Z3400 Encounter for supervision of normal first pregnancy, unspecified trimester: Secondary | ICD-10-CM

## 2018-06-07 DIAGNOSIS — O99212 Obesity complicating pregnancy, second trimester: Secondary | ICD-10-CM

## 2018-06-07 DIAGNOSIS — Z1151 Encounter for screening for human papillomavirus (HPV): Secondary | ICD-10-CM | POA: Diagnosis not present

## 2018-06-07 DIAGNOSIS — E559 Vitamin D deficiency, unspecified: Secondary | ICD-10-CM

## 2018-06-07 DIAGNOSIS — I639 Cerebral infarction, unspecified: Secondary | ICD-10-CM

## 2018-06-07 DIAGNOSIS — Z124 Encounter for screening for malignant neoplasm of cervix: Secondary | ICD-10-CM | POA: Diagnosis not present

## 2018-06-07 DIAGNOSIS — Z23 Encounter for immunization: Secondary | ICD-10-CM | POA: Diagnosis not present

## 2018-06-07 DIAGNOSIS — O99210 Obesity complicating pregnancy, unspecified trimester: Secondary | ICD-10-CM

## 2018-06-07 DIAGNOSIS — O09892 Supervision of other high risk pregnancies, second trimester: Secondary | ICD-10-CM

## 2018-06-07 DIAGNOSIS — Z3490 Encounter for supervision of normal pregnancy, unspecified, unspecified trimester: Secondary | ICD-10-CM | POA: Insufficient documentation

## 2018-06-07 DIAGNOSIS — Z6841 Body Mass Index (BMI) 40.0 and over, adult: Secondary | ICD-10-CM

## 2018-06-07 DIAGNOSIS — O09899 Supervision of other high risk pregnancies, unspecified trimester: Secondary | ICD-10-CM | POA: Insufficient documentation

## 2018-06-07 MED ORDER — PRENATAL VITAMINS 28-0.8 MG PO TABS: 1.0000 | ORAL_TABLET | Freq: Every day | ORAL | 8 refills | Status: DC

## 2018-06-07 NOTE — Progress Notes (Signed)
Last pap 8/17

## 2018-06-07 NOTE — Progress Notes (Signed)
  Subjective:    Dorothy Marquez is being seen today for her first obstetrical visit. She is a 33 yo single P2 (12 and 1 yo kids) This is not a planned pregnancy. She is at [redacted]w[redacted]d gestation. Her obstetrical history is significant for obesity and h/o CVA.Marland Kitchen Relationship with FOB: significant other, living together. Patient does not intend to breast feed. Pregnancy history fully reviewed.  Patient reports no complaints.  Review of Systems:   Review of Systems  Objective:     BP 117/78   Pulse 96   Temp (!) 97.3 F (36.3 C)   Wt 260 lb (117.9 kg)   LMP 01/24/2018   Breastfeeding Unknown   BMI 47.55 kg/m  Physical Exam  Exam  Breathing, conversing, and ambulating normally Well nourished, well hydrated White female, no apparent distress Heart- rrr Lungs- CTAB Breast exam- normal bilaterally Multiple tattoes Kelle Darting- benign, obese Speculum exam normal, pap smear obtained    Assessment:    Pregnancy: G3P2000 Patient Active Problem List   Diagnosis Date Noted  . Obesity in pregnancy 06/07/2018  . Supervision of other high risk pregnancy, antepartum, unspecified trimester 06/07/2018  . CVA (cerebral vascular accident) (HCC) 06/07/2018  . 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 Dysphagia 11/07/2016  . Intraparenchymal hemorrhage of brain (HCC) 11/06/2016  . Supervision of normal first pregnancy, antepartum 03/03/2016  . PTSD (post-traumatic stress disorder) 03/03/2016  . Episodic tension-type headache, not intractable 10/08/2015  . Mild episode of recurrent major depressive disorder (HCC) 05/25/2015  . GAD (generalized anxiety disorder) 05/25/2015  . Mood disorder (HCC) 05/25/2015  . Inattention 05/25/2015  . Need for prophylactic vaccination and inoculation against influenza 05/25/2015  . History of gastric bypass 05/25/2015  . Vitamin D deficiency 05/25/2015  . Folate deficiency 05/25/2015  .  Thiamine deficiency 05/25/2015  . Mild obstructive sleep apnea 08/28/2014  . Dyslipidemia 08/26/2014  . Elevated blood pressure 08/26/2014  . 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 Essential (primary) hypertension 08/08/2013  . Clinical depression 05/30/2013  . HLD (hyperlipidemia) 08/28/2012  . Obstructive apnea 08/28/2012       Plan:     Initial labs drawn. Prenatal vitamins. Problem list reviewed and updated. AFP3 discussed: declined. Role of ultrasound in pregnancy discussed; fetal survey: ordered. Amniocentesis discussed: declined. Baby Scripts OPX Flu vaccine today Next visit will be at 27 weeks for 2 hour GTT Routine labs + TSH, CMP, pr/cr, vitamin D level Pap smear today  Allie Bossier 06/07/2018

## 2018-06-08 ENCOUNTER — Other Ambulatory Visit: Payer: Self-pay | Admitting: Obstetrics & Gynecology

## 2018-06-08 LAB — COMPREHENSIVE METABOLIC PANEL
AG Ratio: 1.4 (calc) (ref 1.0–2.5)
ALT: 7 U/L (ref 6–29)
AST: 11 U/L (ref 10–30)
Albumin: 3.6 g/dL (ref 3.6–5.1)
Alkaline phosphatase (APISO): 65 U/L (ref 31–125)
BUN/Creatinine Ratio: 11 (calc) (ref 6–22)
BUN: 6 mg/dL — ABNORMAL LOW (ref 7–25)
CO2: 22 mmol/L (ref 20–32)
Calcium: 9.3 mg/dL (ref 8.6–10.2)
Chloride: 107 mmol/L (ref 98–110)
Creat: 0.53 mg/dL (ref 0.50–1.10)
Globulin: 2.6 g/dL (calc) (ref 1.9–3.7)
Glucose, Bld: 83 mg/dL (ref 65–99)
Potassium: 4.6 mmol/L (ref 3.5–5.3)
Sodium: 139 mmol/L (ref 135–146)
Total Bilirubin: 0.5 mg/dL (ref 0.2–1.2)
Total Protein: 6.2 g/dL (ref 6.1–8.1)

## 2018-06-08 LAB — OBSTETRIC PANEL
Absolute Monocytes: 616 cells/uL (ref 200–950)
Antibody Screen: NOT DETECTED
Basophils Absolute: 18 cells/uL (ref 0–200)
Basophils Relative: 0.2 %
Eosinophils Absolute: 62 cells/uL (ref 15–500)
Eosinophils Relative: 0.7 %
HCT: 40.9 % (ref 35.0–45.0)
Hemoglobin: 13.8 g/dL (ref 11.7–15.5)
Hepatitis B Surface Ag: NONREACTIVE
Lymphs Abs: 1417 cells/uL (ref 850–3900)
MCH: 32.7 pg (ref 27.0–33.0)
MCHC: 33.7 g/dL (ref 32.0–36.0)
MCV: 96.9 fL (ref 80.0–100.0)
MPV: 12.3 fL (ref 7.5–12.5)
Monocytes Relative: 7 %
Neutro Abs: 6688 cells/uL (ref 1500–7800)
Neutrophils Relative %: 76 %
Platelets: 330 10*3/uL (ref 140–400)
RBC: 4.22 10*6/uL (ref 3.80–5.10)
RDW: 12.2 % (ref 11.0–15.0)
RPR Ser Ql: NONREACTIVE
Rubella: 1.29 index
Total Lymphocyte: 16.1 %
WBC: 8.8 10*3/uL (ref 3.8–10.8)

## 2018-06-08 LAB — HEMOGLOBIN A1C
Hgb A1c MFr Bld: 4.2 % of total Hgb (ref <5.7)
Mean Plasma Glucose: 74 (calc)
eAG (mmol/L): 4.1 (calc)

## 2018-06-08 LAB — PROTEIN / CREATININE RATIO, URINE
Creatinine, Urine: 213 mg/dL (ref 20–275)
Protein/Creat Ratio: 99 mg/g creat (ref 21–161)
Protein/Creatinine Ratio: 0.099 mg/mg creat (ref 0.021–0.16)
Total Protein, Urine: 21 mg/dL (ref 5–24)

## 2018-06-08 LAB — VITAMIN D 25 HYDROXY (VIT D DEFICIENCY, FRACTURES): Vit D, 25-Hydroxy: 12 ng/mL — ABNORMAL LOW (ref 30–100)

## 2018-06-08 LAB — TSH: TSH: 2.3 mIU/L

## 2018-06-08 LAB — HIV ANTIBODY (ROUTINE TESTING W REFLEX): HIV 1&2 Ab, 4th Generation: NONREACTIVE

## 2018-06-08 MED ORDER — VITAMIN D (ERGOCALCIFEROL) 1.25 MG (50000 UNIT) PO CAPS: 50000.0000 [IU] | ORAL_CAPSULE | ORAL | 0 refills | Status: DC

## 2018-06-08 NOTE — Progress Notes (Signed)
Vitamin d prescribed for level of 12 (deficient)

## 2018-06-09 LAB — URINE CULTURE, OB REFLEX

## 2018-06-09 LAB — CULTURE, OB URINE

## 2018-06-11 ENCOUNTER — Telehealth: Payer: Self-pay | Admitting: *Deleted

## 2018-06-11 NOTE — Telephone Encounter (Signed)
-----   Message from Allie Bossier, MD sent at 06/08/2018 11:56 AM EDT ----- Please let her know that I prescribed vitamin d because she is deficient

## 2018-06-11 NOTE — Telephone Encounter (Signed)
LM on voicemail of test results and that she has a RX at [facility] to call if she has any questions.

## 2018-06-11 NOTE — Telephone Encounter (Signed)
Called pt to inform her of Vitamin D results and that Vitamin D had been prescribed.  Pt did not pick up.  Left message requesting pt call the office.

## 2018-06-11 NOTE — Telephone Encounter (Signed)
-----   Message from Osvaldo Human, RN sent at 06/11/2018  1:20 PM EDT -----  ----- Message ----- From: Allie Bossier, MD Sent: 06/08/2018  11:56 AM EDT To: [facility]  Please let her know that I prescribed vitamin d because she is deficient

## 2018-06-13 LAB — CYTOLOGY - PAP
Chlamydia: NEGATIVE
Diagnosis: NEGATIVE
HPV: NOT DETECTED
Neisseria Gonorrhea: NEGATIVE

## 2018-06-27 ENCOUNTER — Other Ambulatory Visit: Payer: Self-pay

## 2018-06-27 ENCOUNTER — Ambulatory Visit (HOSPITAL_COMMUNITY)
Admission: RE | Admit: 2018-06-27 | Discharge: 2018-06-27 | Disposition: A | Discharge status: Home / Self Care | Payer: Medicaid Other | Source: Ambulatory Visit | Attending: Obstetrics and Gynecology | Admitting: Obstetrics and Gynecology

## 2018-06-27 ENCOUNTER — Other Ambulatory Visit: Payer: Self-pay | Admitting: Obstetrics & Gynecology

## 2018-06-27 DIAGNOSIS — Z6841 Body Mass Index (BMI) 40.0 and over, adult: Secondary | ICD-10-CM | POA: Insufficient documentation

## 2018-06-27 DIAGNOSIS — O99842 Bariatric surgery status complicating pregnancy, second trimester: Secondary | ICD-10-CM | POA: Diagnosis not present

## 2018-06-27 DIAGNOSIS — Z363 Encounter for antenatal screening for malformations: Secondary | ICD-10-CM

## 2018-06-27 DIAGNOSIS — O99212 Obesity complicating pregnancy, second trimester: Secondary | ICD-10-CM | POA: Diagnosis not present

## 2018-06-27 DIAGNOSIS — O0932 Supervision of pregnancy with insufficient antenatal care, second trimester: Secondary | ICD-10-CM

## 2018-06-27 DIAGNOSIS — Z3A20 20 weeks gestation of pregnancy: Secondary | ICD-10-CM

## 2018-06-27 DIAGNOSIS — O2692 Pregnancy related conditions, unspecified, second trimester: Secondary | ICD-10-CM

## 2018-06-28 ENCOUNTER — Other Ambulatory Visit (HOSPITAL_COMMUNITY): Payer: Self-pay | Admitting: *Deleted

## 2018-06-28 DIAGNOSIS — Z362 Encounter for other antenatal screening follow-up: Secondary | ICD-10-CM

## 2018-07-03 ENCOUNTER — Other Ambulatory Visit: Payer: Self-pay

## 2018-07-03 ENCOUNTER — Encounter: Payer: Self-pay | Admitting: Certified Nurse Midwife

## 2018-07-03 ENCOUNTER — Ambulatory Visit (INDEPENDENT_AMBULATORY_CARE_PROVIDER_SITE_OTHER): Payer: Medicaid Other | Admitting: Certified Nurse Midwife

## 2018-07-03 VITALS — BP 85/44

## 2018-07-03 DIAGNOSIS — O99210 Obesity complicating pregnancy, unspecified trimester: Secondary | ICD-10-CM

## 2018-07-03 DIAGNOSIS — O99212 Obesity complicating pregnancy, second trimester: Secondary | ICD-10-CM

## 2018-07-03 DIAGNOSIS — O09899 Supervision of other high risk pregnancies, unspecified trimester: Secondary | ICD-10-CM

## 2018-07-03 DIAGNOSIS — Z3A22 22 weeks gestation of pregnancy: Secondary | ICD-10-CM

## 2018-07-03 NOTE — Progress Notes (Signed)
PRENATAL VISIT NOTE TELEHEALTH VIRTUAL OBSTETRICS VISIT ENCOUNTER NOTE  I connected with Dorothy Marquez on 07/03/18 at  8:30 AM EDT by Webex at home and verified that I am speaking with the correct person using two identifiers.   I discussed the limitations, risks, security and privacy concerns of performing an evaluation and management service by telephone and the availability of in person appointments. I also discussed with the patient that there may be a patient responsible charge related to this service. The patient expressed understanding and agreed to proceed. Subjective:  Dorothy Marquez is a 33 y.o. G3P2000 at 3071w6d being seen today for ongoing prenatal care.  She is currently monitored for the following issues for this high-risk pregnancy and has S/P laparoscopic sleeve gastrectomy; Dyslipidemia; Elevated blood pressure; Mild obstructive sleep apnea; Clinical depression; Essential (primary) hypertension; HLD (hyperlipidemia); Obstructive apnea; Mild episode of recurrent major depressive disorder (HCC); GAD (generalized anxiety disorder); Mood disorder (HCC); Inattention; Need for prophylactic vaccination and inoculation against influenza; History of gastric bypass; Vitamin D deficiency; Folate deficiency; Thiamine deficiency; Episodic tension-type headache, not intractable; Supervision of normal first pregnancy, antepartum; PTSD (post-traumatic stress disorder); Amphetamine abuse (HCC); Nontraumatic subcortical hemorrhage of right cerebral hemisphere [facility](HCC); Intraparenchymal hemorrhage of brain (HCC); Dysphagia; Obesity in pregnancy; Supervision of other high risk pregnancy, antepartum, unspecified trimester; CVA (cerebral vascular accident) (HCC); and Late Entry to Babyscripts-March 2020-Social Distancing on their problem list.  Patient reports no complaints.  Reports fetal movement. Contractions: Not present. Vag. Bleeding: None.  Movement: Present. Denies any contractions, bleeding or leaking of  fluid.   The following portions of the patient's history were reviewed and updated as appropriate: allergies, current medications, past family history, past medical history, past social history, past surgical history and problem list.   Objective:   Vitals:   07/03/18 0829  BP: (!) 85/44    Fetal Status:     Movement: Present     General:  Alert, oriented and cooperative. Patient is in no acute distress.  Respiratory: Normal respiratory effort, no problems with respiration noted  Mental Status: Normal mood and affect. Normal behavior. Normal judgment and thought content.  Rest of physical exam deferred due to type of encounter  Assessment and Plan:  Pregnancy: G3P2000 at 5971w6d 1. Supervision of other high risk pregnancy, antepartum, unspecified trimester - Patient doing well, no complaints - Anticipatory guidance on upcoming appointments with GTT at next appointment, discussed fasting prior to appointment  - Discussed importance on continuing to enter BP in babyscripts weekly - Reviewed labs from NOB appointment, continue Vitamin D supplement  2. Obesity in pregnancy - Discussed recommended weight gain during pregnancy   Preterm labor symptoms and general obstetric precautions including but not limited to vaginal bleeding, contractions, leaking of fluid and fetal movement were reviewed in detail with the patient. I discussed the assessment and treatment plan with the patient. The patient was provided an opportunity to ask questions and all were answered. The patient agreed with the plan and demonstrated an understanding of the instructions. The patient was advised to call back or seek an in-person office evaluation/[facility] for any urgent or concerning symptoms. Please refer to After Visit Summary for other counseling recommendations.  I provided 10 minutes of non-face-to-face time during this encounter. Return in about 5 weeks (around 08/07/2018) for  ROB/2hrGTT.  Future Appointments  Date Time Provider Department Center  08/08/2018  2:15 PM [facility] US 4 [facility]-M[facility]US M[facility]-US  08/10/2018  8:15 AM  Donette Larry, CNM [facility]    Sharyon Cable, CNM [facility] [employer], [facility]

## 2018-07-03 NOTE — Progress Notes (Signed)
BP on 4/23: 80/53 BP today: 85/44

## 2018-08-08 ENCOUNTER — Ambulatory Visit (HOSPITAL_COMMUNITY): Payer: Medicaid Other | Admitting: *Deleted

## 2018-08-08 ENCOUNTER — Other Ambulatory Visit (HOSPITAL_COMMUNITY): Payer: Self-pay | Admitting: *Deleted

## 2018-08-08 ENCOUNTER — Other Ambulatory Visit: Payer: Self-pay

## 2018-08-08 ENCOUNTER — Encounter (HOSPITAL_COMMUNITY): Payer: Self-pay

## 2018-08-08 ENCOUNTER — Ambulatory Visit (HOSPITAL_COMMUNITY)
Admission: RE | Admit: 2018-08-08 | Discharge: 2018-08-08 | Disposition: A | Discharge status: Home / Self Care | Payer: Medicaid Other | Source: Ambulatory Visit | Attending: Obstetrics and Gynecology | Admitting: Obstetrics and Gynecology

## 2018-08-08 VITALS — BP 129/76 | HR 113 | Temp 98.5°F

## 2018-08-08 DIAGNOSIS — O0932 Supervision of pregnancy with insufficient antenatal care, second trimester: Secondary | ICD-10-CM

## 2018-08-08 DIAGNOSIS — Z3A26 26 weeks gestation of pregnancy: Secondary | ICD-10-CM

## 2018-08-08 DIAGNOSIS — I639 Cerebral infarction, unspecified: Secondary | ICD-10-CM | POA: Diagnosis present

## 2018-08-08 DIAGNOSIS — O99210 Obesity complicating pregnancy, unspecified trimester: Secondary | ICD-10-CM

## 2018-08-08 DIAGNOSIS — O0930 Supervision of pregnancy with insufficient antenatal care, unspecified trimester: Secondary | ICD-10-CM | POA: Diagnosis present

## 2018-08-08 DIAGNOSIS — O2692 Pregnancy related conditions, unspecified, second trimester: Secondary | ICD-10-CM

## 2018-08-08 DIAGNOSIS — O99842 Bariatric surgery status complicating pregnancy, second trimester: Secondary | ICD-10-CM | POA: Diagnosis not present

## 2018-08-08 DIAGNOSIS — O09899 Supervision of other high risk pregnancies, unspecified trimester: Secondary | ICD-10-CM | POA: Diagnosis present

## 2018-08-08 DIAGNOSIS — Z362 Encounter for other antenatal screening follow-up: Secondary | ICD-10-CM | POA: Insufficient documentation

## 2018-08-08 DIAGNOSIS — O99212 Obesity complicating pregnancy, second trimester: Secondary | ICD-10-CM

## 2018-08-10 ENCOUNTER — Ambulatory Visit (INDEPENDENT_AMBULATORY_CARE_PROVIDER_SITE_OTHER): Payer: Medicaid Other | Admitting: Certified Nurse Midwife

## 2018-08-10 ENCOUNTER — Other Ambulatory Visit: Payer: Self-pay

## 2018-08-10 VITALS — BP 108/68 | HR 91 | Temp 98.3°F | Wt 266.0 lb

## 2018-08-10 DIAGNOSIS — O09893 Supervision of other high risk pregnancies, third trimester: Secondary | ICD-10-CM

## 2018-08-10 DIAGNOSIS — R3915 Urgency of urination: Secondary | ICD-10-CM

## 2018-08-10 DIAGNOSIS — Z23 Encounter for immunization: Secondary | ICD-10-CM

## 2018-08-10 DIAGNOSIS — O09899 Supervision of other high risk pregnancies, unspecified trimester: Secondary | ICD-10-CM

## 2018-08-10 DIAGNOSIS — O99213 Obesity complicating pregnancy, third trimester: Secondary | ICD-10-CM

## 2018-08-10 DIAGNOSIS — I639 Cerebral infarction, unspecified: Secondary | ICD-10-CM

## 2018-08-10 DIAGNOSIS — O99210 Obesity complicating pregnancy, unspecified trimester: Secondary | ICD-10-CM

## 2018-08-10 DIAGNOSIS — Z3400 Encounter for supervision of normal first pregnancy, unspecified trimester: Secondary | ICD-10-CM

## 2018-08-10 DIAGNOSIS — Z3A28 28 weeks gestation of pregnancy: Secondary | ICD-10-CM

## 2018-08-10 LAB — POCT URINALYSIS DIPSTICK
Appearance: ABNORMAL
Bilirubin, UA: NEGATIVE
Blood, UA: NEGATIVE
Glucose, UA: NEGATIVE
Ketones, UA: NEGATIVE
Leukocytes, UA: NEGATIVE
Nitrite, UA: NEGATIVE
Protein, UA: NEGATIVE
Spec Grav, UA: >=1.03 — AB (ref 1.010–1.025)
Urobilinogen, UA: NEGATIVE E.U./dL — AB
pH, UA: 6 (ref 5.0–8.0)

## 2018-08-10 NOTE — Progress Notes (Signed)
Subjective:  Scottie Kriston is a 33 y.o. G3P2000 at [redacted]w[redacted]d being seen today for ongoing prenatal care.  She is currently monitored for the following issues for this high-risk pregnancy and has S/P laparoscopic sleeve gastrectomy; Dyslipidemia; Elevated blood pressure; Mild obstructive sleep apnea; Clinical depression; Essential (primary) hypertension; HLD (hyperlipidemia); Obstructive apnea; Mild episode of recurrent major depressive disorder (HCC); GAD (generalized anxiety disorder); Mood disorder (HCC); Inattention; Need for prophylactic vaccination and inoculation against influenza; History of gastric bypass; Vitamin D deficiency; Folate deficiency; Thiamine deficiency; Episodic tension-type headache, not intractable; Supervision of normal first pregnancy, antepartum; PTSD (post-traumatic stress disorder); Amphetamine abuse (HCC); Nontraumatic subcortical hemorrhage of right cerebral hemisphere [facility]); Intraparenchymal hemorrhage of brain (HCC); Dysphagia; Obesity in pregnancy; Supervision of other high risk pregnancy, antepartum, unspecified trimester; CVA (cerebral vascular accident) (HCC); and Late Entry to Babyscripts-March 2020-Social Distancing on their problem list.  Patient reports urinary urgency, no dysuria, hematuria, or fevers. Reports urine is dark..  Contractions: Not present. Vag. Bleeding: None.  Movement: Present. Denies leaking of fluid.   The following portions of the patient's history were reviewed and updated as appropriate: allergies, current medications, past family history, past medical history, past social history, past surgical history and problem list. Problem list updated.  Objective:   Vitals:   08/10/18 0825  BP: 108/68  Pulse: 91  Temp: 98.3 F (36.8 C)  Weight: 120.7 kg    Fetal Status: Fetal Heart Rate (bpm): 146 Fundal Height: 31 cm Movement: Present     General:  Alert, oriented and cooperative. Patient is in no acute distress.  Skin: Skin is warm and dry. No  rash noted.   Cardiovascular: Normal heart rate noted  Respiratory: Normal respiratory effort, no problems with respiration noted  Abdomen: Soft, gravid, appropriate for gestational age. Pain/Pressure: Absent     Pelvic: Vag. Bleeding: None Vag D/C Character: Thin   Cervical exam deferred        Extremities: Normal range of motion.  Edema: None  Mental Status: Normal mood and affect. Normal behavior. Normal judgment and thought content.   Urinalysis:      Assessment and Plan:  Pregnancy: G3P2000 at [redacted]w[redacted]d  1. Supervision of other high risk pregnancy, antepartum, unspecified trimester - 2Hr GTT w/ 1 Hr Carpenter 75 g - CBC - HIV antibody (with reflex) - RPR - POCT Urinalysis Dipstick - Culture, OB Urine  2. Urinary urgency - UA and UC today  3. Obesity in pregnancy - [place] 2 days ago-normal growth and AFI - repeat growth [place] in 6 weeks  4. Cerebrovascular accident (CVA), unspecified mechanism (HCC) - followed by neuro, appt next month  Preterm labor symptoms and general obstetric precautions including but not limited to vaginal bleeding, contractions, leaking of fluid and fetal movement were reviewed in detail with the patient. Please refer to After Visit Summary for other counseling recommendations.  Return in about 4 weeks (around 09/07/2018). for virtual visit   Donette Larry, [place]

## 2018-08-10 NOTE — Progress Notes (Signed)
Poss UTI.

## 2018-08-12 LAB — CULTURE, OB URINE

## 2018-08-12 LAB — URINE CULTURE, OB REFLEX: Organism ID, Bacteria: NO GROWTH

## 2018-08-13 LAB — CBC
HCT: 40.6 % (ref 35.0–45.0)
Hemoglobin: 13.5 g/dL (ref 11.7–15.5)
MCH: 33.4 pg — ABNORMAL HIGH (ref 27.0–33.0)
MCHC: 33.3 g/dL (ref 32.0–36.0)
MCV: 100.5 fL — ABNORMAL HIGH (ref 80.0–100.0)
MPV: 11.4 fL (ref 7.5–12.5)
Platelets: 319 10*3/uL (ref 140–400)
RBC: 4.04 10*6/uL (ref 3.80–5.10)
RDW: 11.8 % (ref 11.0–15.0)
WBC: 9.2 10*3/uL (ref 3.8–10.8)

## 2018-08-13 LAB — HIV ANTIBODY (ROUTINE TESTING W REFLEX): HIV 1&2 Ab, 4th Generation: NONREACTIVE

## 2018-08-13 LAB — RPR: RPR Ser Ql: NONREACTIVE

## 2018-08-17 ENCOUNTER — Telehealth: Payer: Self-pay | Admitting: *Deleted

## 2018-08-17 ENCOUNTER — Other Ambulatory Visit: Payer: Medicaid Other | Admitting: *Deleted

## 2018-08-17 NOTE — Telephone Encounter (Signed)
Left patient message to call and reschedule cancelled GTT appointment due to car trouble.

## 2018-09-11 ENCOUNTER — Telehealth: Payer: Self-pay | Admitting: *Deleted

## 2018-09-11 ENCOUNTER — Encounter: Payer: Medicaid Other | Admitting: Certified Nurse Midwife

## 2018-09-11 MED ORDER — BLOOD GLUCOSE MONITOR KIT: PACK | 0 refills | Status: DC

## 2018-09-11 NOTE — Telephone Encounter (Signed)
Pt was a no show for OB f/u today.  When called by Kish she thought her appt was tomorrow.  She does have a f/u U/S @ [facility] tomorrow.  Pt has never returned for her 2 hrr GTT as she got sick her last attempt.  She is now saying that she cannot do it.  Glucose monitor with lancets and strips sent to her pharmacy.  She is aware that this is the only other alternative for checking her sugars.  She is aware that she will test QID Fast and 2 hr post meals.  She has been instructed to send her results through my chart. 

## 2018-09-12 ENCOUNTER — Ambulatory Visit (HOSPITAL_COMMUNITY): Payer: Medicaid Other | Admitting: *Deleted

## 2018-09-12 ENCOUNTER — Other Ambulatory Visit (HOSPITAL_COMMUNITY): Payer: Self-pay | Admitting: Maternal & Fetal Medicine

## 2018-09-12 ENCOUNTER — Ambulatory Visit (HOSPITAL_COMMUNITY)
Admission: RE | Admit: 2018-09-12 | Discharge: 2018-09-12 | Disposition: A | Discharge status: Home / Self Care | Payer: Medicaid Other | Source: Ambulatory Visit | Attending: Obstetrics and Gynecology | Admitting: Obstetrics and Gynecology

## 2018-09-12 ENCOUNTER — Other Ambulatory Visit: Payer: Self-pay

## 2018-09-12 ENCOUNTER — Encounter (HOSPITAL_COMMUNITY): Payer: Self-pay

## 2018-09-12 DIAGNOSIS — O365990 Maternal care for other known or suspected poor fetal growth, unspecified trimester, not applicable or unspecified: Secondary | ICD-10-CM

## 2018-09-12 DIAGNOSIS — O99210 Obesity complicating pregnancy, unspecified trimester: Secondary | ICD-10-CM | POA: Diagnosis not present

## 2018-09-12 DIAGNOSIS — Z362 Encounter for other antenatal screening follow-up: Secondary | ICD-10-CM

## 2018-09-12 DIAGNOSIS — O0933 Supervision of pregnancy with insufficient antenatal care, third trimester: Secondary | ICD-10-CM | POA: Diagnosis not present

## 2018-09-12 DIAGNOSIS — I639 Cerebral infarction, unspecified: Secondary | ICD-10-CM | POA: Insufficient documentation

## 2018-09-12 DIAGNOSIS — O09899 Supervision of other high risk pregnancies, unspecified trimester: Secondary | ICD-10-CM | POA: Insufficient documentation

## 2018-09-12 DIAGNOSIS — Z3A31 31 weeks gestation of pregnancy: Secondary | ICD-10-CM

## 2018-09-12 DIAGNOSIS — O99213 Obesity complicating pregnancy, third trimester: Secondary | ICD-10-CM

## 2018-09-12 DIAGNOSIS — O2693 Pregnancy related conditions, unspecified, third trimester: Secondary | ICD-10-CM

## 2018-09-12 DIAGNOSIS — O99843 Bariatric surgery status complicating pregnancy, third trimester: Secondary | ICD-10-CM

## 2018-09-12 NOTE — Procedures (Signed)
Dorothy Marquez 06/08/1985 [redacted]w[redacted]d  Fetus A Non-Stress Test Interpretation for 09/12/18  Indication: IUGR  Fetal Heart Rate A Mode: External Baseline Rate (A): 140 bpm Variability: Moderate Accelerations: 10 x 10, 15 x 15 Decelerations: None Multiple birth?: No  Uterine Activity Mode: Palpation, Toco Contraction Frequency (min): None Resting Tone Palpated: Relaxed Resting Time: Adequate  Interpretation (Fetal Testing) Nonstress Test Interpretation: Reactive Comments: Reviewed tracing with Dr. Gertie Exon

## 2018-09-13 ENCOUNTER — Ambulatory Visit (INDEPENDENT_AMBULATORY_CARE_PROVIDER_SITE_OTHER): Payer: Medicaid Other | Admitting: Obstetrics & Gynecology

## 2018-09-13 VITALS — BP 128/80 | HR 94 | Wt 262.0 lb

## 2018-09-13 DIAGNOSIS — Z3400 Encounter for supervision of normal first pregnancy, unspecified trimester: Secondary | ICD-10-CM

## 2018-09-13 DIAGNOSIS — O09893 Supervision of other high risk pregnancies, third trimester: Secondary | ICD-10-CM

## 2018-09-13 DIAGNOSIS — O99210 Obesity complicating pregnancy, unspecified trimester: Secondary | ICD-10-CM

## 2018-09-13 DIAGNOSIS — O09899 Supervision of other high risk pregnancies, unspecified trimester: Secondary | ICD-10-CM

## 2018-09-13 DIAGNOSIS — Z3A33 33 weeks gestation of pregnancy: Secondary | ICD-10-CM

## 2018-09-13 DIAGNOSIS — O99213 Obesity complicating pregnancy, third trimester: Secondary | ICD-10-CM

## 2018-09-13 DIAGNOSIS — O365930 Maternal care for other known or suspected poor fetal growth, third trimester, not applicable or unspecified: Secondary | ICD-10-CM

## 2018-09-13 DIAGNOSIS — O365990 Maternal care for other known or suspected poor fetal growth, unspecified trimester, not applicable or unspecified: Secondary | ICD-10-CM

## 2018-09-13 DIAGNOSIS — I10 Essential (primary) hypertension: Secondary | ICD-10-CM

## 2018-09-13 NOTE — Progress Notes (Signed)
   PRENATAL VISIT NOTE  Subjective:  Dorothy Marquez is a 33 y.o. G3P2002 at [redacted]w[redacted]d being seen today for ongoing prenatal care.  She is currently monitored for the following issues for this high-risk pregnancy and has S/P laparoscopic sleeve gastrectomy; Dyslipidemia; Elevated blood pressure; Mild obstructive sleep apnea; Clinical depression; Essential (primary) hypertension; HLD (hyperlipidemia); Obstructive apnea; Mild episode of recurrent major depressive disorder ([place]); GAD (generalized anxiety disorder); Mood disorder ([place]); Inattention; Need for prophylactic vaccination and inoculation against influenza; History of gastric bypass; Vitamin D deficiency; Folate deficiency; Thiamine deficiency; Episodic tension-type headache, not intractable; Supervision of normal first pregnancy, antepartum; PTSD (post-traumatic stress disorder); Amphetamine abuse ([place]); Nontraumatic subcortical hemorrhage of right cerebral hemisphere [facility]); Intraparenchymal hemorrhage of brain ([place]); Dysphagia; Obesity in pregnancy; Supervision of other high risk pregnancy, antepartum, unspecified trimester; CVA (cerebral vascular accident) ([place]); and Late Entry to Babyscripts-March 2020-Social Distancing on their problem list.  Patient reports an itchy rash on her back, butt, and left leg.  Contractions: Not present. Vag. Bleeding: None.  Movement: Present. Denies leaking of fluid.   The following portions of the patient's history were reviewed and updated as appropriate: allergies, current medications, past family history, past medical history, past social history, past surgical history and problem list.   Objective:   Vitals:   09/13/18 1539  BP: 128/80  Pulse: 94  Weight: 262 lb (118.8 kg)    Fetal Status: Fetal Heart Rate (bpm):  149   Movement: Present     General:  Alert, oriented and cooperative. Patient is in no acute distress.  Skin: Skin is warm and dry. No rash noted.   Cardiovascular: Normal heart rate noted   Respiratory: Normal respiratory effort, no problems with respiration noted  Abdomen: Soft, gravid, appropriate for gestational age.  Pain/Pressure: Absent     Pelvic: Cervical exam deferred        Extremities: Normal range of motion.  Edema: Trace  Mental Status: Normal mood and affect. Normal behavior. Normal judgment and thought content.  Her back and left leg have a rash that is consistent with a contact dermatitis. Assessment and Plan:  Pregnancy: G3P2002 at [redacted]w[redacted]d 1. Supervision of other high risk pregnancy, antepartum, unspecified trimester  2. Obesity in pregnancy - MFM u/s sheduled  3. Essential (primary) hypertension - good BPs, no meds - rec baby asa 4. Itchy rash- I have rec'd cortisone cream prn 5. Fetal growth restriction- weekly MFM BPPs with NSTs  Preterm labor symptoms and general obstetric precautions including but not limited to vaginal bleeding, contractions, leaking of fluid and fetal movement were reviewed in detail with the patient. Please refer to After Visit Summary for other counseling recommendations.   No follow-ups on file.  Future Appointments  Date Time Provider [place]  09/18/2018  2:00 PM [place] [facility]FC-US  09/18/2018  2:00 PM WH-MFC [place] 2 WH-MFCUS MFC-US  09/18/2018  3:00 PM [place] NST [place] MFC-US  09/25/2018  3:15 PM [place] NURSE WH-MFC MFC-US  09/25/2018  3:15 PM WH-MFC [place] 4 WH-MFCUS MFC-US  09/25/2018  3:45 PM WH-MFC NST [place] MFC-US  10/02/2018  3:15 PM WH-MFC NURSE WH-MFC MFC-US  10/02/2018  3:15 PM WH-MFC [place] 4 WH-MFCUS MFC-US  10/02/2018  3:45 PM WH-MFC NST [place] MFC-US  10/09/2018  3:15 PM WH-MFC NURSE WH-MFC MFC-US  10/09/2018  3:15 PM [place] 4 WH-MFCUS MFC-US  10/09/2018  3:45 PM WH-MFC NST WH-MFC MFC-US    Emily Filbert, MD

## 2018-09-13 NOTE — Progress Notes (Signed)
PT c/o rash on her back, arm and leg

## 2018-09-14 ENCOUNTER — Other Ambulatory Visit (HOSPITAL_COMMUNITY): Payer: Self-pay | Admitting: *Deleted

## 2018-09-14 DIAGNOSIS — O365930 Maternal care for other known or suspected poor fetal growth, third trimester, not applicable or unspecified: Secondary | ICD-10-CM

## 2018-09-18 ENCOUNTER — Encounter (HOSPITAL_COMMUNITY): Payer: Self-pay

## 2018-09-18 ENCOUNTER — Ambulatory Visit (HOSPITAL_COMMUNITY): Payer: Medicaid Other

## 2018-09-18 ENCOUNTER — Ambulatory Visit (HOSPITAL_COMMUNITY)
Admission: RE | Admit: 2018-09-18 | Discharge: 2018-09-18 | Disposition: A | Discharge status: Home / Self Care | Payer: Medicaid Other | Source: Ambulatory Visit | Attending: Obstetrics and Gynecology | Admitting: Obstetrics and Gynecology

## 2018-09-25 ENCOUNTER — Ambulatory Visit (HOSPITAL_COMMUNITY): Payer: Medicaid Other | Admitting: *Deleted

## 2018-09-25 ENCOUNTER — Ambulatory Visit (HOSPITAL_BASED_OUTPATIENT_CLINIC_OR_DEPARTMENT_OTHER): Payer: Medicaid Other | Admitting: *Deleted

## 2018-09-25 ENCOUNTER — Encounter (HOSPITAL_COMMUNITY): Payer: Self-pay | Admitting: *Deleted

## 2018-09-25 ENCOUNTER — Other Ambulatory Visit: Payer: Self-pay

## 2018-09-25 ENCOUNTER — Ambulatory Visit (HOSPITAL_COMMUNITY)
Admission: RE | Admit: 2018-09-25 | Discharge: 2018-09-25 | Disposition: A | Discharge status: Home / Self Care | Payer: Medicaid Other | Source: Ambulatory Visit | Attending: Obstetrics and Gynecology | Admitting: Obstetrics and Gynecology

## 2018-09-25 DIAGNOSIS — O09899 Supervision of other high risk pregnancies, unspecified trimester: Secondary | ICD-10-CM

## 2018-09-25 DIAGNOSIS — O2693 Pregnancy related conditions, unspecified, third trimester: Secondary | ICD-10-CM

## 2018-09-25 DIAGNOSIS — O2690 Pregnancy related conditions, unspecified, unspecified trimester: Secondary | ICD-10-CM | POA: Diagnosis present

## 2018-09-25 DIAGNOSIS — O365990 Maternal care for other known or suspected poor fetal growth, unspecified trimester, not applicable or unspecified: Secondary | ICD-10-CM | POA: Insufficient documentation

## 2018-09-25 DIAGNOSIS — O365931 Maternal care for other known or suspected poor fetal growth, third trimester, fetus 1: Secondary | ICD-10-CM | POA: Diagnosis present

## 2018-09-25 DIAGNOSIS — O99843 Bariatric surgery status complicating pregnancy, third trimester: Secondary | ICD-10-CM | POA: Insufficient documentation

## 2018-09-25 DIAGNOSIS — O0933 Supervision of pregnancy with insufficient antenatal care, third trimester: Secondary | ICD-10-CM | POA: Diagnosis not present

## 2018-09-25 DIAGNOSIS — Z3A33 33 weeks gestation of pregnancy: Secondary | ICD-10-CM

## 2018-09-25 DIAGNOSIS — I639 Cerebral infarction, unspecified: Secondary | ICD-10-CM | POA: Diagnosis present

## 2018-09-25 DIAGNOSIS — O99210 Obesity complicating pregnancy, unspecified trimester: Secondary | ICD-10-CM | POA: Diagnosis present

## 2018-09-25 DIAGNOSIS — O99213 Obesity complicating pregnancy, third trimester: Secondary | ICD-10-CM

## 2018-09-25 DIAGNOSIS — O365930 Maternal care for other known or suspected poor fetal growth, third trimester, not applicable or unspecified: Secondary | ICD-10-CM | POA: Diagnosis present

## 2018-09-25 DIAGNOSIS — O0932 Supervision of pregnancy with insufficient antenatal care, second trimester: Secondary | ICD-10-CM | POA: Diagnosis present

## 2018-09-25 NOTE — Procedures (Signed)
Dorothy Marquez [birth year] [redacted]w[redacted]d  Fetus A Non-Stress Test Interpretation for 09/25/18  Indication: IUGR  Fetal Heart Rate A Mode: External Baseline Rate (A): 135 bpm Variability: Moderate Accelerations: 15 x 15 Decelerations: None Multiple birth?: No  Uterine Activity Mode: Palpation, Toco Contraction Frequency (min): None Resting Tone Palpated: Relaxed Resting Time: Adequate  Interpretation (Fetal Testing) Nonstress Test Interpretation: Reactive Comments: EFM tracing reviewed by Dr. Donalee Citrin

## 2018-10-01 ENCOUNTER — Encounter: Payer: Medicaid Other | Admitting: Obstetrics & Gynecology

## 2018-10-02 ENCOUNTER — Ambulatory Visit (HOSPITAL_COMMUNITY): Payer: Medicaid Other

## 2018-10-02 ENCOUNTER — Ambulatory Visit (HOSPITAL_COMMUNITY): Admission: RE | Admit: 2018-10-02 | Payer: Medicaid Other | Source: Ambulatory Visit

## 2018-10-09 ENCOUNTER — Ambulatory Visit (HOSPITAL_COMMUNITY): Payer: Medicaid Other | Admitting: *Deleted

## 2018-10-09 ENCOUNTER — Other Ambulatory Visit: Payer: Self-pay

## 2018-10-09 ENCOUNTER — Other Ambulatory Visit (HOSPITAL_COMMUNITY): Payer: Self-pay | Admitting: Maternal & Fetal Medicine

## 2018-10-09 ENCOUNTER — Encounter (HOSPITAL_COMMUNITY): Payer: Self-pay

## 2018-10-09 ENCOUNTER — Ambulatory Visit (HOSPITAL_COMMUNITY)
Admission: RE | Admit: 2018-10-09 | Discharge: 2018-10-09 | Disposition: A | Discharge status: Home / Self Care | Payer: Medicaid Other | Source: Ambulatory Visit | Attending: Obstetrics and Gynecology | Admitting: Obstetrics and Gynecology

## 2018-10-09 DIAGNOSIS — O365930 Maternal care for other known or suspected poor fetal growth, third trimester, not applicable or unspecified: Secondary | ICD-10-CM | POA: Insufficient documentation

## 2018-10-09 DIAGNOSIS — I639 Cerebral infarction, unspecified: Secondary | ICD-10-CM

## 2018-10-09 DIAGNOSIS — O365990 Maternal care for other known or suspected poor fetal growth, unspecified trimester, not applicable or unspecified: Secondary | ICD-10-CM | POA: Insufficient documentation

## 2018-10-09 DIAGNOSIS — Z3A35 35 weeks gestation of pregnancy: Secondary | ICD-10-CM

## 2018-10-09 DIAGNOSIS — O09899 Supervision of other high risk pregnancies, unspecified trimester: Secondary | ICD-10-CM

## 2018-10-09 DIAGNOSIS — O9843 Viral hepatitis complicating the puerperium: Secondary | ICD-10-CM | POA: Diagnosis not present

## 2018-10-09 DIAGNOSIS — O0933 Supervision of pregnancy with insufficient antenatal care, third trimester: Secondary | ICD-10-CM

## 2018-10-09 DIAGNOSIS — O99213 Obesity complicating pregnancy, third trimester: Secondary | ICD-10-CM

## 2018-10-09 DIAGNOSIS — O99843 Bariatric surgery status complicating pregnancy, third trimester: Secondary | ICD-10-CM

## 2018-10-09 DIAGNOSIS — O365931 Maternal care for other known or suspected poor fetal growth, third trimester, fetus 1: Secondary | ICD-10-CM

## 2018-10-09 DIAGNOSIS — O2693 Pregnancy related conditions, unspecified, third trimester: Secondary | ICD-10-CM | POA: Diagnosis not present

## 2018-10-09 DIAGNOSIS — O99210 Obesity complicating pregnancy, unspecified trimester: Secondary | ICD-10-CM | POA: Diagnosis present

## 2018-10-09 DIAGNOSIS — Z362 Encounter for other antenatal screening follow-up: Secondary | ICD-10-CM | POA: Diagnosis not present

## 2018-10-09 NOTE — Procedures (Signed)
Dorothy Marquez [birth year] [redacted]w[redacted]d  Fetus A Non-Stress Test Interpretation for 10/09/18  Indication: IUGR  Fetal Heart Rate A Mode: External Baseline Rate (A): 135 bpm Variability: Moderate Accelerations: 10 x 10, 15 x 15 Decelerations: None Multiple birth?: No  Uterine Activity Mode: Palpation, Toco Contraction Frequency (min): U/I Contraction Duration (sec): 30-40 Resting Tone Palpated: Relaxed Resting Time: Adequate  Interpretation (Fetal Testing) Nonstress Test Interpretation: Reactive Comments: EFM tracing reviewed by Dr. Donalee Citrin

## 2018-10-10 ENCOUNTER — Other Ambulatory Visit (HOSPITAL_COMMUNITY): Payer: Self-pay | Admitting: *Deleted

## 2018-10-10 DIAGNOSIS — O365990 Maternal care for other known or suspected poor fetal growth, unspecified trimester, not applicable or unspecified: Secondary | ICD-10-CM

## 2018-10-16 ENCOUNTER — Other Ambulatory Visit: Payer: Self-pay

## 2018-10-16 ENCOUNTER — Ambulatory Visit (HOSPITAL_COMMUNITY): Payer: Medicaid Other | Admitting: *Deleted

## 2018-10-16 ENCOUNTER — Ambulatory Visit (HOSPITAL_COMMUNITY)
Admission: RE | Admit: 2018-10-16 | Discharge: 2018-10-16 | Disposition: A | Discharge status: Home / Self Care | Payer: Medicaid Other | Source: Ambulatory Visit | Attending: Obstetrics and Gynecology | Admitting: Obstetrics and Gynecology

## 2018-10-16 DIAGNOSIS — I639 Cerebral infarction, unspecified: Secondary | ICD-10-CM | POA: Diagnosis present

## 2018-10-16 DIAGNOSIS — O2693 Pregnancy related conditions, unspecified, third trimester: Secondary | ICD-10-CM

## 2018-10-16 DIAGNOSIS — O365930 Maternal care for other known or suspected poor fetal growth, third trimester, not applicable or unspecified: Secondary | ICD-10-CM | POA: Insufficient documentation

## 2018-10-16 DIAGNOSIS — O09899 Supervision of other high risk pregnancies, unspecified trimester: Secondary | ICD-10-CM | POA: Insufficient documentation

## 2018-10-16 DIAGNOSIS — O99213 Obesity complicating pregnancy, third trimester: Secondary | ICD-10-CM

## 2018-10-16 DIAGNOSIS — O0933 Supervision of pregnancy with insufficient antenatal care, third trimester: Secondary | ICD-10-CM | POA: Diagnosis not present

## 2018-10-16 DIAGNOSIS — Z3A36 36 weeks gestation of pregnancy: Secondary | ICD-10-CM

## 2018-10-16 DIAGNOSIS — O99210 Obesity complicating pregnancy, unspecified trimester: Secondary | ICD-10-CM | POA: Diagnosis present

## 2018-10-16 DIAGNOSIS — O99843 Bariatric surgery status complicating pregnancy, third trimester: Secondary | ICD-10-CM

## 2018-10-16 DIAGNOSIS — O365990 Maternal care for other known or suspected poor fetal growth, unspecified trimester, not applicable or unspecified: Secondary | ICD-10-CM | POA: Insufficient documentation

## 2018-10-16 DIAGNOSIS — O365931 Maternal care for other known or suspected poor fetal growth, third trimester, fetus 1: Secondary | ICD-10-CM

## 2018-10-16 NOTE — Procedures (Signed)
Dorothy Marquez 03-31-1985 [redacted]w[redacted]d  Fetus A Non-Stress Test Interpretation for 10/16/18  Indication: IUGR  Fetal Heart Rate A Mode: External Baseline Rate (A): 130 bpm Variability: Moderate Accelerations: 15 x 15 Decelerations: None Multiple birth?: No  Uterine Activity Mode: Palpation, Toco Contraction Frequency (min): None Contraction Quality: Mild Resting Tone Palpated: Relaxed Resting Time: Adequate  Interpretation (Fetal Testing) Nonstress Test Interpretation: Reactive Comments: EFM tracing reviewed by. Dr. Gertie Exon

## 2018-10-23 ENCOUNTER — Other Ambulatory Visit (HOSPITAL_COMMUNITY)
Admission: RE | Admit: 2018-10-23 | Discharge: 2018-10-23 | Disposition: A | Discharge status: Home / Self Care | Payer: Medicaid Other | Source: Ambulatory Visit | Attending: Advanced Practice Midwife | Admitting: Advanced Practice Midwife

## 2018-10-23 ENCOUNTER — Encounter: Payer: Self-pay | Admitting: *Deleted

## 2018-10-23 ENCOUNTER — Ambulatory Visit (HOSPITAL_COMMUNITY)
Admission: RE | Admit: 2018-10-23 | Discharge: 2018-10-23 | Disposition: A | Discharge status: Home / Self Care | Payer: Medicaid Other | Source: Ambulatory Visit | Attending: Obstetrics and Gynecology | Admitting: Obstetrics and Gynecology

## 2018-10-23 ENCOUNTER — Ambulatory Visit (INDEPENDENT_AMBULATORY_CARE_PROVIDER_SITE_OTHER): Payer: Medicaid Other | Admitting: Advanced Practice Midwife

## 2018-10-23 ENCOUNTER — Encounter (HOSPITAL_COMMUNITY): Payer: Self-pay | Admitting: *Deleted

## 2018-10-23 ENCOUNTER — Other Ambulatory Visit: Payer: Self-pay

## 2018-10-23 ENCOUNTER — Ambulatory Visit (HOSPITAL_COMMUNITY): Payer: Medicaid Other | Admitting: *Deleted

## 2018-10-23 ENCOUNTER — Telehealth (HOSPITAL_COMMUNITY): Payer: Self-pay | Admitting: *Deleted

## 2018-10-23 VITALS — BP 110/79 | HR 117 | Wt 263.0 lb

## 2018-10-23 DIAGNOSIS — O365931 Maternal care for other known or suspected poor fetal growth, third trimester, fetus 1: Secondary | ICD-10-CM

## 2018-10-23 DIAGNOSIS — I639 Cerebral infarction, unspecified: Secondary | ICD-10-CM

## 2018-10-23 DIAGNOSIS — Z3480 Encounter for supervision of other normal pregnancy, unspecified trimester: Secondary | ICD-10-CM | POA: Insufficient documentation

## 2018-10-23 DIAGNOSIS — O09899 Supervision of other high risk pregnancies, unspecified trimester: Secondary | ICD-10-CM

## 2018-10-23 DIAGNOSIS — O99210 Obesity complicating pregnancy, unspecified trimester: Secondary | ICD-10-CM | POA: Insufficient documentation

## 2018-10-23 DIAGNOSIS — O365930 Maternal care for other known or suspected poor fetal growth, third trimester, not applicable or unspecified: Secondary | ICD-10-CM

## 2018-10-23 DIAGNOSIS — O0993 Supervision of high risk pregnancy, unspecified, third trimester: Secondary | ICD-10-CM

## 2018-10-23 DIAGNOSIS — O0990 Supervision of high risk pregnancy, unspecified, unspecified trimester: Secondary | ICD-10-CM

## 2018-10-23 DIAGNOSIS — O99213 Obesity complicating pregnancy, third trimester: Secondary | ICD-10-CM | POA: Diagnosis not present

## 2018-10-23 DIAGNOSIS — O99843 Bariatric surgery status complicating pregnancy, third trimester: Secondary | ICD-10-CM

## 2018-10-23 DIAGNOSIS — Z3A37 37 weeks gestation of pregnancy: Secondary | ICD-10-CM

## 2018-10-23 DIAGNOSIS — O365990 Maternal care for other known or suspected poor fetal growth, unspecified trimester, not applicable or unspecified: Secondary | ICD-10-CM | POA: Diagnosis not present

## 2018-10-23 DIAGNOSIS — O2693 Pregnancy related conditions, unspecified, third trimester: Secondary | ICD-10-CM

## 2018-10-23 DIAGNOSIS — O0933 Supervision of pregnancy with insufficient antenatal care, third trimester: Secondary | ICD-10-CM

## 2018-10-23 LAB — OB RESULTS CONSOLE GBS: GBS: NEGATIVE

## 2018-10-23 NOTE — Procedures (Signed)
Dorothy Marquez 26-Nov-1985 [redacted]w[redacted]d  Fetus A Non-Stress Test Interpretation for 10/23/18  Indication: IUGR  Fetal Heart Rate A Mode: External Baseline Rate (A): 140 bpm Variability: Moderate Accelerations: 15 x 15 Decelerations: None Multiple birth?: No  Uterine Activity Mode: Palpation, Toco Contraction Frequency (min): None Resting Tone Palpated: Relaxed Resting Time: Adequate  Interpretation (Fetal Testing) Nonstress Test Interpretation: Reactive Comments: EFM tracing reviewed by Dr. Donalee Citrin

## 2018-10-23 NOTE — Telephone Encounter (Signed)
Preadmission screen  

## 2018-10-23 NOTE — Progress Notes (Addendum)
   PRENATAL VISIT NOTE  Subjective:  Dorothy Marquez is a 33 y.o. G3P2002 at [redacted]w[redacted]d being seen today for ongoing prenatal care. She is currently monitored for the following issues for this high-risk pregnancy and has S/P laparoscopic sleeve gastrectomy; Dyslipidemia; Elevated blood pressure; Mild obstructive sleep apnea; Clinical depression; Essential (primary) hypertension; HLD (hyperlipidemia); Obstructive apnea; Mild episode of recurrent major depressive disorder ([place]); GAD (generalized anxiety disorder); Mood disorder ([place]); Inattention; Need for prophylactic vaccination and inoculation against influenza; History of gastric bypass; Vitamin D deficiency; Folate deficiency; Thiamine deficiency; Episodic tension-type headache, not intractable; Supervision of normal first pregnancy, antepartum; PTSD (post-traumatic stress disorder); Amphetamine abuse ([place]); Nontraumatic subcortical hemorrhage of right cerebral hemisphere [facility]); Intraparenchymal hemorrhage of brain ([place]); Dysphagia; Obesity in pregnancy; Supervision of other high risk pregnancy, antepartum, unspecified trimester; CVA (cerebral vascular accident) ([place]); and Late Entry to Babyscripts-March 2020-Social Distancing on their problem list.  Patient reports occasional contractions and possible leaking of fluid. Pt reports that 2 days ago, she had some leaking of clear fluid that wet her underwear. Was enough to put on a panty liner. Is not currently leaking. Contractions: Irritability. Vag. Bleeding: None.  Movement: Present.  The following portions of the patient's history were reviewed and updated as appropriate: allergies, current medications, past family history, past medical history, past social history, past surgical history and problem list.   Objective:   Vitals:   10/23/18 1309  BP: 110/79  Pulse: (!) 117  Weight: 119.3 kg    Fetal Status: Fetal Heart Rate (bpm): 135   Movement: Present     General:  Alert, oriented and cooperative.  Patient is in no acute distress.  Skin: Skin is warm and dry. No rash noted.   Cardiovascular: Normal heart rate noted  Respiratory: Normal respiratory effort, no problems with respiration noted  Abdomen: Soft, gravid, appropriate for gestational age.  Pain/Pressure: Absent     Pelvic: Cervical exam performed        Extremities: Normal range of motion.  Edema: Trace  Mental Status: Normal mood and affect. Normal behavior. Normal judgment and thought content.   Assessment and Plan:  Pregnancy: G3P2002 at [redacted]w[redacted]d 1. Supervision of other normal pregnancy, antepartum - Pt c/o leaking fluid. Speculum exam shows negative pooling, negative Nitrazine, negative fern.  - GBS and GC/CT collected today - Cervix 1/50/-3, vertex, soft, posterior.  - Scheduled IOL for 8/21.   - Culture, beta strep (group b only) - Cervicovaginal ancillary only( [place])   Term labor symptoms and general obstetric precautions including but not limited to vaginal bleeding, contractions, leaking of fluid and fetal movement were reviewed in detail with the patient. Please refer to After Visit Summary for other counseling recommendations.   No follow-ups on file.  Future Appointments  Date Time Provider [place]  10/23/2018  3:00 PM [place] MFC-US  10/23/2018  3:00 PM [place] 3 WH-MFCUS MFC-US  10/23/2018  4:00 PM [place] NST [place] MFC-US    Maryagnes Amos, SNM

## 2018-10-24 ENCOUNTER — Other Ambulatory Visit (HOSPITAL_COMMUNITY)
Admission: RE | Admit: 2018-10-24 | Discharge: 2018-10-24 | Disposition: A | Discharge status: Home / Self Care | Payer: Medicaid Other | Source: Ambulatory Visit | Attending: Obstetrics and Gynecology | Admitting: Obstetrics and Gynecology

## 2018-10-24 DIAGNOSIS — Z20828 Contact with and (suspected) exposure to other viral communicable diseases: Secondary | ICD-10-CM | POA: Diagnosis not present

## 2018-10-24 DIAGNOSIS — Z01812 Encounter for preprocedural laboratory examination: Secondary | ICD-10-CM | POA: Diagnosis not present

## 2018-10-24 LAB — SARS CORONAVIRUS 2 (TAT 6-24 HRS): SARS Coronavirus 2: NEGATIVE

## 2018-10-24 NOTE — MAU Note (Signed)
Covid swab collected. PT tolerated well.Pt asymptomatic 

## 2018-10-25 ENCOUNTER — Other Ambulatory Visit (HOSPITAL_COMMUNITY): Payer: Self-pay | Admitting: Advanced Practice Midwife

## 2018-10-25 LAB — CERVICOVAGINAL ANCILLARY ONLY
Chlamydia: NEGATIVE
Neisseria Gonorrhea: NEGATIVE

## 2018-10-26 ENCOUNTER — Inpatient Hospital Stay (HOSPITAL_COMMUNITY): Payer: Medicaid Other

## 2018-10-26 ENCOUNTER — Encounter (HOSPITAL_COMMUNITY): Payer: Self-pay | Admitting: *Deleted

## 2018-10-26 ENCOUNTER — Other Ambulatory Visit: Payer: Self-pay

## 2018-10-26 ENCOUNTER — Inpatient Hospital Stay (HOSPITAL_COMMUNITY): Payer: Medicaid Other | Admitting: Anesthesiology

## 2018-10-26 ENCOUNTER — Inpatient Hospital Stay (HOSPITAL_COMMUNITY)
Admission: AD | Admit: 2018-10-26 | Discharge: 2018-10-28 | DRG: 807 | Disposition: A | Discharge status: Home / Self Care | Payer: Medicaid Other | Attending: Obstetrics & Gynecology | Admitting: Obstetrics & Gynecology

## 2018-10-26 DIAGNOSIS — O99214 Obesity complicating childbirth: Secondary | ICD-10-CM | POA: Diagnosis present

## 2018-10-26 DIAGNOSIS — O99844 Bariatric surgery status complicating childbirth: Secondary | ICD-10-CM | POA: Diagnosis present

## 2018-10-26 DIAGNOSIS — Z6841 Body Mass Index (BMI) 40.0 and over, adult: Secondary | ICD-10-CM

## 2018-10-26 DIAGNOSIS — I639 Cerebral infarction, unspecified: Secondary | ICD-10-CM

## 2018-10-26 DIAGNOSIS — Z9884 Bariatric surgery status: Secondary | ICD-10-CM

## 2018-10-26 DIAGNOSIS — I6930 Unspecified sequelae of cerebral infarction: Secondary | ICD-10-CM

## 2018-10-26 DIAGNOSIS — O99210 Obesity complicating pregnancy, unspecified trimester: Secondary | ICD-10-CM

## 2018-10-26 DIAGNOSIS — Z3A37 37 weeks gestation of pregnancy: Secondary | ICD-10-CM

## 2018-10-26 DIAGNOSIS — O365930 Maternal care for other known or suspected poor fetal growth, third trimester, not applicable or unspecified: Principal | ICD-10-CM | POA: Diagnosis present

## 2018-10-26 DIAGNOSIS — Z30017 Encounter for initial prescription of implantable subdermal contraceptive: Secondary | ICD-10-CM | POA: Diagnosis not present

## 2018-10-26 DIAGNOSIS — Z3009 Encounter for other general counseling and advice on contraception: Secondary | ICD-10-CM

## 2018-10-26 DIAGNOSIS — O09899 Supervision of other high risk pregnancies, unspecified trimester: Secondary | ICD-10-CM

## 2018-10-26 LAB — CULTURE, BETA STREP (GROUP B ONLY)
MICRO NUMBER:: 783750
SPECIMEN QUALITY:: ADEQUATE

## 2018-10-26 LAB — CBC
HCT: 40.6 % (ref 36.0–46.0)
Hemoglobin: 13.4 g/dL (ref 12.0–15.0)
MCH: 33.5 pg (ref 26.0–34.0)
MCHC: 33 g/dL (ref 30.0–36.0)
MCV: 101.5 fL — ABNORMAL HIGH (ref 80.0–100.0)
Platelets: 348 10*3/uL (ref 150–400)
RBC: 4 MIL/uL (ref 3.87–5.11)
RDW: 13.9 % (ref 11.5–15.5)
WBC: 10.7 10*3/uL — ABNORMAL HIGH (ref 4.0–10.5)
nRBC: 0 % (ref 0.0–0.2)

## 2018-10-26 LAB — TYPE AND SCREEN
ABO/RH(D): A POS
Antibody Screen: NEGATIVE

## 2018-10-26 LAB — RPR: RPR Ser Ql: NONREACTIVE

## 2018-10-26 MED ORDER — ONDANSETRON HCL 4 MG/2ML IJ SOLN: 4.0000 mg | Freq: Four times a day (QID) | INTRAMUSCULAR | Status: DC | PRN

## 2018-10-26 MED ORDER — MISOPROSTOL 25 MCG QUARTER TABLET
25.0000 ug | ORAL_TABLET | ORAL | Status: DC | PRN
  Administered 2018-10-26: 25 ug via VAGINAL
  Filled 2018-10-26: qty 1

## 2018-10-26 MED ORDER — OXYCODONE-ACETAMINOPHEN 5-325 MG PO TABS: 1.0000 | ORAL_TABLET | ORAL | Status: DC | PRN

## 2018-10-26 MED ORDER — SOD CITRATE-CITRIC ACID 500-334 MG/5ML PO SOLN: 30.0000 mL | ORAL | Status: DC | PRN

## 2018-10-26 MED ORDER — OXYTOCIN 40 UNITS IN NORMAL SALINE INFUSION - SIMPLE MED
1.0000 m[IU]/min | INTRAVENOUS | Status: DC
  Administered 2018-10-26: 2 m[IU]/min via INTRAVENOUS

## 2018-10-26 MED ORDER — LACTATED RINGERS IV SOLN
500.0000 mL | Freq: Once | INTRAVENOUS | Status: AC
  Administered 2018-10-26: 1000 mL via INTRAVENOUS

## 2018-10-26 MED ORDER — FENTANYL CITRATE (PF) 100 MCG/2ML IJ SOLN: 50.0000 ug | INTRAMUSCULAR | Status: DC | PRN

## 2018-10-26 MED ORDER — ONDANSETRON HCL 4 MG PO TABS: 4.0000 mg | ORAL_TABLET | ORAL | Status: DC | PRN

## 2018-10-26 MED ORDER — SENNOSIDES-DOCUSATE SODIUM 8.6-50 MG PO TABS
2.0000 | ORAL_TABLET | ORAL | Status: DC
  Administered 2018-10-26 – 2018-10-28 (×2): 2 via ORAL
  Filled 2018-10-26 (×2): qty 2

## 2018-10-26 MED ORDER — BENZOCAINE-MENTHOL 20-0.5 % EX AERO: 1.0000 | INHALATION_SPRAY | CUTANEOUS | Status: DC | PRN

## 2018-10-26 MED ORDER — FENTANYL-BUPIVACAINE-NACL 0.5-0.125-0.9 MG/250ML-% EP SOLN
12.0000 mL/h | EPIDURAL | Status: DC | PRN
  Filled 2018-10-26: qty 250

## 2018-10-26 MED ORDER — ACETAMINOPHEN 325 MG PO TABS: 650.0000 mg | ORAL_TABLET | ORAL | Status: DC | PRN

## 2018-10-26 MED ORDER — MEASLES, MUMPS & RUBELLA VAC IJ SOLR: 0.5000 mL | Freq: Once | INTRAMUSCULAR | Status: DC

## 2018-10-26 MED ORDER — HYDROXYZINE HCL 50 MG PO TABS: 50.0000 mg | ORAL_TABLET | Freq: Four times a day (QID) | ORAL | Status: DC | PRN

## 2018-10-26 MED ORDER — PHENYLEPHRINE 40 MCG/ML (10ML) SYRINGE FOR IV PUSH (FOR BLOOD PRESSURE SUPPORT)
80.0000 ug | PREFILLED_SYRINGE | INTRAVENOUS | Status: DC | PRN
  Filled 2018-10-26: qty 10

## 2018-10-26 MED ORDER — LACTATED RINGERS IV SOLN
INTRAVENOUS | Status: DC
  Administered 2018-10-26 (×2): via INTRAVENOUS

## 2018-10-26 MED ORDER — SIMETHICONE 80 MG PO CHEW: 80.0000 mg | CHEWABLE_TABLET | ORAL | Status: DC | PRN

## 2018-10-26 MED ORDER — IBUPROFEN 600 MG PO TABS
600.0000 mg | ORAL_TABLET | Freq: Four times a day (QID) | ORAL | Status: DC
  Administered 2018-10-26 – 2018-10-28 (×6): 600 mg via ORAL
  Filled 2018-10-26 (×7): qty 1

## 2018-10-26 MED ORDER — PRENATAL MULTIVITAMIN CH
1.0000 | ORAL_TABLET | Freq: Every day | ORAL | Status: DC
  Administered 2018-10-27 – 2018-10-28 (×2): 1 via ORAL
  Filled 2018-10-26 (×2): qty 1

## 2018-10-26 MED ORDER — LIDOCAINE HCL (PF) 1 % IJ SOLN
INTRAMUSCULAR | Status: DC | PRN
  Administered 2018-10-26: 7 mL via EPIDURAL
  Administered 2018-10-26: 5 mL via EPIDURAL

## 2018-10-26 MED ORDER — EPHEDRINE 5 MG/ML INJ: 10.0000 mg | INTRAVENOUS | Status: DC | PRN

## 2018-10-26 MED ORDER — LACTATED RINGERS IV SOLN: 500.0000 mL | INTRAVENOUS | Status: DC | PRN

## 2018-10-26 MED ORDER — COCONUT OIL OIL: 1.0000 | TOPICAL_OIL | Status: DC | PRN

## 2018-10-26 MED ORDER — WITCH HAZEL-GLYCERIN EX PADS: 1.0000 "application " | MEDICATED_PAD | CUTANEOUS | Status: DC | PRN

## 2018-10-26 MED ORDER — TETANUS-DIPHTH-ACELL PERTUSSIS 5-2.5-18.5 LF-MCG/0.5 IM SUSP: 0.5000 mL | Freq: Once | INTRAMUSCULAR | Status: DC

## 2018-10-26 MED ORDER — DIBUCAINE (PERIANAL) 1 % EX OINT: 1.0000 "application " | TOPICAL_OINTMENT | CUTANEOUS | Status: DC | PRN

## 2018-10-26 MED ORDER — TERBUTALINE SULFATE 1 MG/ML IJ SOLN: 0.2500 mg | Freq: Once | INTRAMUSCULAR | Status: DC | PRN

## 2018-10-26 MED ORDER — ONDANSETRON HCL 4 MG/2ML IJ SOLN: 4.0000 mg | INTRAMUSCULAR | Status: DC | PRN

## 2018-10-26 MED ORDER — OXYTOCIN BOLUS FROM INFUSION
500.0000 mL | Freq: Once | INTRAVENOUS | Status: AC
  Administered 2018-10-26: 19:00:00 500 mL via INTRAVENOUS

## 2018-10-26 MED ORDER — LIDOCAINE HCL (PF) 1 % IJ SOLN: 30.0000 mL | INTRAMUSCULAR | Status: DC | PRN

## 2018-10-26 MED ORDER — DIPHENHYDRAMINE HCL 50 MG/ML IJ SOLN: 12.5000 mg | INTRAMUSCULAR | Status: DC | PRN

## 2018-10-26 MED ORDER — SODIUM CHLORIDE (PF) 0.9 % IJ SOLN
INTRAMUSCULAR | Status: DC | PRN
  Administered 2018-10-26: 12 mL/h via EPIDURAL

## 2018-10-26 MED ORDER — FLEET ENEMA 7-19 GM/118ML RE ENEM: 1.0000 | ENEMA | RECTAL | Status: DC | PRN

## 2018-10-26 MED ORDER — PHENYLEPHRINE 40 MCG/ML (10ML) SYRINGE FOR IV PUSH (FOR BLOOD PRESSURE SUPPORT)
80.0000 ug | PREFILLED_SYRINGE | INTRAVENOUS | Status: DC | PRN
  Administered 2018-10-26: 18:00:00 80 ug via INTRAVENOUS

## 2018-10-26 MED ORDER — OXYCODONE-ACETAMINOPHEN 5-325 MG PO TABS: 2.0000 | ORAL_TABLET | ORAL | Status: DC | PRN

## 2018-10-26 MED ORDER — DIPHENHYDRAMINE HCL 25 MG PO CAPS: 25.0000 mg | ORAL_CAPSULE | Freq: Four times a day (QID) | ORAL | Status: DC | PRN

## 2018-10-26 MED ORDER — OXYTOCIN 40 UNITS IN NORMAL SALINE INFUSION - SIMPLE MED
2.5000 [IU]/h | INTRAVENOUS | Status: DC
  Filled 2018-10-26: qty 1000

## 2018-10-26 NOTE — Discharge Summary (Signed)
Postpartum Discharge Summary     Patient Name: Dorothy Marquez DOB: [birth year] MRN: [id number]  Date of admission: 10/26/2018 Delivering Provider: Chauncey Mann   Date of discharge: 10/28/2018  Admitting diagnosis: PREGNANCY Intrauterine pregnancy: [redacted]w[redacted]d    Secondary diagnosis:  Active Problems:   History of gastric bypass   Indication for care in labor or delivery   History of CVA with residual deficit   BMI 40.0-44.9, adult ([facility]  Additional problems: None     Discharge diagnosis: Term Pregnancy Delivered                                                                                                Post partum procedures:nexplanon insertion  Augmentation: AROM, Pitocin and Cytotec  Complications: None  Hospital course:  Induction of Labor With Vaginal Delivery   33y.o. yo G3P3003 at 33w3das admitted to the hospital 10/26/2018 for induction of labor.  Indication for induction: IUGR.  Patient had an uncomplicated labor course as follows. Patient presented to L&D for IOL secondary to IUGR. Initial SVE: 2.5/50%/-3 and Cytotec was placed. Patient then received Pitocin and an Epidural. AROM and then FSE was placed due to difficulty tracing FHR. She then progressed to complete.  Membrane Rupture Time/Date: 6:39 PM ,10/26/2018   Intrapartum Procedures: Episiotomy: None [1]                                         Lacerations:  None [1]  Patient had delivery of a Viable infant.  Information for the patient's newborn:  CrArena, Lindahl0[[id number]]Delivery Method: Vaginal, Spontaneous(Filed from Delivery Summary)    10/26/2018  Details of delivery can be found in separate delivery note.  Patient had a routine postpartum course notable for nexplanon insertion on 10/28/2018. Patient is discharged home 10/28/18.  Magnesium Sulfate recieved: No BMZ received: No  Physical exam  Vitals:   10/27/18 0555 10/27/18 0921 10/27/18 1500 10/28/18 0537  BP: (!) 137/50 105/61 133/85 111/68   Pulse: 64 75 77 61  Resp:  18 18   Temp: 98 F (36.7 C) 97.8 F (36.6 C) 97.9 F (36.6 C) 98.5 F (36.9 C)  TempSrc: Oral Axillary Oral Oral  SpO2: 100%   100%  Weight:      Height:       General: alert, cooperative and no distress Lochia: appropriate Uterine Fundus: firm Incision: N/A DVT Evaluation: No evidence of DVT seen on physical exam. Labs: Lab Results  Component Value Date   WBC 10.7 (H) 10/26/2018   HGB 13.4 10/26/2018   HCT 40.6 10/26/2018   MCV 101.5 (H) 10/26/2018   PLT 348 10/26/2018   CMP Latest Ref Rng & Units 06/07/2018  Glucose 65 - 99 mg/dL 83  BUN 7 - 25 mg/dL 6(L)  Creatinine 0.50 - 1.10 mg/dL 0.53  Sodium 135 - 146 mmol/L 139  Potassium 3.5 - 5.3 mmol/L 4.6  Chloride 98 - 110 mmol/L 107  CO2 20 - 32 mmol/L 22  Calcium  8.6 - 10.2 mg/dL 9.3  Total Protein 6.1 - 8.1 g/dL 6.2  Total Bilirubin 0.2 - 1.2 mg/dL 0.5  Alkaline Phos 33 - 115 U/L -  AST 10 - 30 U/L 11  ALT 6 - 29 U/L 7    Discharge instruction: per After Visit Summary and "Baby and Me Booklet".  After visit meds:  Allergies as of 10/28/2018      Reactions   Iodinated Diagnostic Agents Hives      Medication List    STOP taking these medications   blood glucose meter kit and supplies Kit   Diclegis 10-10 MG Tbec Generic drug: Doxylamine-Pyridoxine     TAKE these medications   acetaminophen 325 MG tablet Commonly known as: Tylenol Take 2 tablets (650 mg total) by mouth every 4 (four) hours as needed (for pain scale < 4). What changed:   how much to take  when to take this  reasons to take this   ibuprofen 600 MG tablet Commonly known as: ADVIL Take 1 tablet (600 mg total) by mouth every 6 (six) hours.   polyethylene glycol powder 17 GM/SCOOP powder Commonly known as: GlycoLax Take 17 g by mouth daily.   Prenatal Vitamins 28-0.8 MG Tabs Take 1 tablet by mouth daily.   Vitamin D (Ergocalciferol) 1.25 MG (50000 UT) Caps capsule Commonly known as: DRISDOL Take 1  capsule (50,000 Units total) by mouth every 7 (seven) days.       Diet: routine diet  Activity: Advance as tolerated. Pelvic rest for 6 weeks.   Outpatient follow up:4 weeks Follow up Appt:No future appointments. Follow up Visit:   Please schedule this patient for Postpartum visit in: 4 weeks with the following provider: Any provider High risk pregnancy complicated by: IUGR Delivery mode:  SVD Anticipated Birth Control:  Nexplanon PP Procedures needed: None  Schedule Integrated [place] visit: yes      Newborn Data: Live born female  Birth Weight:   APGAR: 76, 9  Newborn Delivery   Birth date/time: 10/26/2018 18:41:00 Delivery type: Vaginal, Spontaneous      Baby Feeding: Breast Disposition:home with mother   10/28/2018 Clarnce Flock, MD

## 2018-10-26 NOTE — Anesthesia Preprocedure Evaluation (Signed)
Anesthesia Evaluation  Patient identified by MRN, date of birth, ID band Patient awake    Reviewed: Allergy & Precautions, H&P , NPO status , Patient's Chart, lab work & pertinent test results  Airway Mallampati: II  TM Distance: >3 FB Neck ROM: full    Dental no notable dental hx. (+) Teeth Intact   Pulmonary    Pulmonary exam normal breath sounds clear to auscultation       Cardiovascular negative cardio ROS Normal cardiovascular exam Rhythm:regular Rate:Normal     Neuro/Psych PSYCHIATRIC DISORDERS Anxiety Depression CVA, No Residual Symptoms    GI/Hepatic negative GI ROS, Neg liver ROS,   Endo/Other  Morbid obesity  Renal/GU negative Renal ROS     Musculoskeletal   Abdominal (+) + obese,   Peds  Hematology negative hematology ROS (+)   Anesthesia Other Findings   Reproductive/Obstetrics (+) Pregnancy                             Anesthesia Physical Anesthesia Plan  ASA: III  Anesthesia Plan: Epidural   Post-op Pain Management:    Induction:   PONV Risk Score and Plan:   Airway Management Planned:   Additional Equipment:   Intra-op Plan:   Post-operative Plan:   Informed Consent: I have reviewed the patients History and Physical, chart, labs and discussed the procedure including the risks, benefits and alternatives for the proposed anesthesia with the patient or authorized representative who has indicated his/her understanding and acceptance.       Plan Discussed with:   Anesthesia Plan Comments:         Anesthesia Quick Evaluation

## 2018-10-26 NOTE — H&P (Addendum)
OBSTETRIC ADMISSION HISTORY AND PHYSICAL  Dorothy Marquez is a 33 y.o. female G70P2002 with IUP at 1w3dby [place] EDD 11/13/18. presenting for IOL for IUGR, EFW 1900g, 2% at 34 weeks *Patient was late to prenatal care*  Reports fetal movement. Denies vaginal bleeding.Has had some clear vaginal discharge.   [facility]  Last Ultrasound 10/23/2018 Fetal Evaluation  Num Of Fetuses:         1  Fetal Heart Rate(bpm):  165  Cardiac Activity:       Observed  Presentation:           Cephalic  Placenta:               Anterior  Amniotic Fluid  AFI FV:      Within normal limits  AFI Sum(cm)     %Tile       Largest Pocket(cm)  9.5             20          3.16  RUQ(cm)       RLQ(cm)       LUQ(cm)        LLQ(cm)  1.49          2.96          3.16           1.89  Severe fetal growth restriction  Amniotic fluid is normal and good fetal activity is seen. Fetal  breathing movements did not meet the criteria (BPP).  Umbilical artery Doppler showed normal forward diastolic  flow. NST is reactive. BPP 8/10. We reassured the patient of  the findings.  Induction of labor scheduled by team.  . 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 She was taking this for fatigue and buying from a dealer. No further strokes. No pregnancies post stroke except current.  Past Medical History:  Diagnosis Date  . Anxiety   . Depression   . History of hiatal hernia   . History of kidney stones    x5 episodes  . Stroke ([facility]     Past Surgical History: Past Surgical History:  Procedure Laterality Date  . LAPAROSCOPIC GASTRIC SLEEVE RESECTION WITH HIATAL HERNIA REPAIR N/A 08/25/2014   Procedure: LAPAROSCOPIC GASTRIC SLEEVE  RESECTION WITH HIATAL HERNIA REPAIR;  Surgeon: EGreer Pickerel MD;  Location: [facility] ORS;  Service: General;  Laterality: N/A;  . UPPER GI ENDOSCOPY N/A 08/25/2014   Procedure: UPPER GI ENDOSCOPY;  Surgeon: EGreer Pickerel MD;  Location: [facility]S;  Service: General;  Laterality: N/A;  . WISDOM TOOTH EXTRACTION      Obstetrical History: OB History    Gravida  3   Para  2   Term  2   Preterm      AB      Living  2     SAB      TAB      Ectopic      Multiple  0   Live Births  2           Social History: Social History   Socioeconomic History  . Marital status: Single    Spouse name: Not on file  . Number of children: Not on file  . Years of  education: Not on file  . Highest education level: Not on file  Occupational History  . Occupation: disabled  Social Needs  . Financial resource strain: Not on file  . Food insecurity    Worry: Not on file    Inability: Not on file  . Transportation needs    Medical: Not on file    Non-medical: Not on file  Tobacco Use  . Smoking status: Never Smoker  . Smokeless tobacco: Never Used  Substance and Sexual Activity  . Alcohol use: Yes    Comment: rare- social  . Drug use: No  . Sexual activity: Yes    Birth control/protection: None  Lifestyle  . Physical activity    Days per week: Not on file    Minutes per session: Not on file  . Stress: Not on file  Relationships  . Social Herbalist on phone: Not on file    Gets together: Not on file    Attends religious service: Not on file    Active member of club or organization: Not on file    Attends meetings of clubs or organizations: Not on file    Relationship status: Not on file  Other Topics Concern  . Not on file  Social History Narrative  . Not on file    Family History: Family History  Problem Relation Age of Onset  . Depression Mother   . Diabetes Mother   . Depression Brother   . Hyperlipidemia Brother   . Diabetes Maternal Aunt   . Depression  Maternal Aunt   . Cancer Maternal Aunt        uterine  . Alcohol abuse Maternal Uncle     Allergies: Allergies  Allergen Reactions  . Iodinated Diagnostic Agents Hives    Medications Prior to Admission  Medication Sig Dispense Refill Last Dose  . acetaminophen (TYLENOL) 325 MG tablet Take by mouth.     . blood glucose meter kit and supplies KIT Dispense based on patient and insurance preference. To use to take CBG's QID. 1 each 0   . Doxylamine-Pyridoxine (DICLEGIS) 10-10 MG TBEC Diclegis 10 mg-10 mg tablet,delayed release  Take 2 tablets QHS, then on day 3 add 1 tablet before breakfast, then on day 4 add 1 tablet before lunch.     . Prenatal Vit-Fe Fumarate-FA (PRENATAL VITAMINS) 28-0.8 MG TABS Take 1 tablet by mouth daily. 30 tablet 8   . Vitamin D, Ergocalciferol, (DRISDOL) 1.25 MG (50000 UT) CAPS capsule Take 1 capsule (50,000 Units total) by mouth every 7 (seven) days. (Patient not taking: Reported on 10/23/2018) 16 capsule 0      Review of Systems  All systems reviewed and negative except as stated in HPI  Blood pressure 114/66, pulse 85, temperature 97.7 F (36.5 C), temperature source Oral, height _0  (1.702 m), weight 117.9 kg, last menstrual period 01/24/2018, unknown if currently breastfeeding. General appearance: alert, cooperative and appears stated age, comfortable Lungs: lung clear on ausc, no crackles or wheeze, no respiratory distress Heart: RRR, S1 and S2 present, no s3 or s4, no rubs or gallops  Abdomen: soft, non-tender; gravid abdomen and increased body habitus  Extremities: Homans sign is negative, no sign of DVT Presentation: cephalic  Neuro: Left-sided weakness at baseline  Fetal monitoring: 130bpm HR, moderate variability, accelerations present, no decels, category 1-reassuring  Uterine activity: not tracing due to maternal body habitus  Dilation: 2.5 Effacement (%): 50 Station: -3 Exam by:: J.Cox, RN  Prenatal labs:  ABO, Rh: --/--/A POS (08/21  0175) Antibody: NEG (08/21 1025) Rubella: 1.29 (04/02 1440) RPR: NON-REACTIVE (06/05 1012)  HBsAg: NON-REACTIVE (04/02 1440)  HIV: NON-REACTIVE (06/05 1012)  GBS:   pending  Glucola: HbA1c 4.2%. Could not tolerate GTT, doing home blood glucose monitoring  Genetic screening: declined  Prenatal Transfer Tool  Maternal Diabetes: No Genetic Screening: Declined Maternal Ultrasounds/Referrals: IUGR Fetal Ultrasounds or other Referrals:  Referred to Materal Fetal Medicine  Maternal Substance Abuse: None through this pregnancy. Previously addicted to Adderall  Significant Maternal Medications:  Meds include: Other: ferrous fumerate, vit d Significant Maternal Lab Results: None  Results for orders placed or performed during the hospital encounter of 10/26/18 (from the past 24 hour(s))  CBC   Collection Time: 10/26/18  7:38 AM  Result Value Ref Range   WBC 10.7 (H) 4.0 - 10.5 K/uL   RBC 4.00 3.87 - 5.11 MIL/uL   Hemoglobin 13.4 12.0 - 15.0 g/dL   HCT 40.6 36.0 - 46.0 %   MCV 101.5 (H) 80.0 - 100.0 fL   MCH 33.5 26.0 - 34.0 pg   MCHC 33.0 30.0 - 36.0 g/dL   RDW 13.9 11.5 - 15.5 %   Platelets 348 150 - 400 K/uL   nRBC 0.0 0.0 - 0.2 %  Type and screen [place]   Collection Time: 10/26/18  8:23 AM  Result Value Ref Range   ABO/RH(D) A POS    Antibody Screen NEG    Sample Expiration      10/29/2018,2359 Performed at [facility], [place] [address] [place], [place] [address]     Patient Active Problem List   Diagnosis Date Noted  . Indication for care in labor or delivery 10/26/2018  . Obesity in pregnancy 06/07/2018  . Supervision of other high risk pregnancy, antepartum, unspecified trimester 06/07/2018  . CVA (cerebral vascular accident) ([place]) 06/07/2018  . Late Entry to Babyscripts-March 2020-Social Distancing 06/07/2018  . 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 Nontraumatic subcortical hemorrhage of right cerebral hemisphere ([place]) 11/11/2016  .  Dysphagia 11/07/2016  . Intraparenchymal hemorrhage of brain ([place]) 11/06/2016  . Supervision of normal first pregnancy, antepartum 03/03/2016  . PTSD (post-traumatic stress disorder) 03/03/2016  . Episodic tension-type headache, not intractable 10/08/2015  . Mild episode of recurrent major depressive disorder ([place]) 05/25/2015  . GAD (generalized anxiety disorder) 05/25/2015  . Mood disorder ([place]) 05/25/2015  . Inattention 05/25/2015  . Need for prophylactic vaccination and inoculation against influenza 05/25/2015  . History of gastric bypass 05/25/2015  . Vitamin D deficiency 05/25/2015  . Folate deficiency 05/25/2015  . Thiamine deficiency 05/25/2015  . Mild obstructive sleep apnea 08/28/2014  . Dyslipidemia 08/26/2014  . Elevated blood pressure 08/26/2014  . 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 Essential (primary) hypertension 08/08/2013  . Clinical depression 05/30/2013  . HLD (hyperlipidemia) 08/28/2012  . Obstructive apnea 08/28/2012    Assessment/Plan:  Jazzie Trampe is a 33 y.o. G3P2002 at 110w3dhere for IOL for IUGR.   Labor:  -- Vertex by exam. Cytotec placed at [place] RN. Will now start Pit; see labor progress update note -- pain control: no analgesia at present as pt declined. Would like epidural in active stage of labor. -- Anticipate vaginal delivery  Fetal Wellbeing:  -- GBS (negative); COVID neg -- continuous fetal monitoring   Postpartum Planning -- bottle feeding -- 08/10/18 Tdap  --Tubal ligation as per patient's request. Paperwork signed on 6/5  PLattie Haw MD PGY-1, [facility]   I saw and evaluated the patient. I agree with the findings and the plan of care as documented in the resident's note and have edited as necessary.   Barrington Ellison, MD [facility] Family Medicine Fellow, [facility] for [employer], [place]

## 2018-10-26 NOTE — Progress Notes (Signed)
Labor Progress Note Dorothy Marquez is a 33 y.o. G3P2002 at [redacted]w[redacted]d presented for IOL for IUGR.  S: Patient reports mild intermittent contractions. Denies other complaints.  O:  BP 124/64   Pulse 75   Temp 97.7 F (36.5 C) (Oral)   Ht 5\' 7"  (1.702 m)   Wt 117.9 kg   LMP 01/24/2018   BMI 40.72 kg/m  EFM: 135, reactive, moderate variability, pos accels, no decels  CVE: Dilation: 3.5 Effacement (%): 50 Station: -3 Presentation: Vertex Exam by:: J.Cox, RN   A&P: 33 y.o. T3S2876 [redacted]w[redacted]d here for IOL secondary to IUGR.   #Labor: S/p Cytotec and will start Pit at 2x2. Anticipate vaginal delivery.  #Pain: Desires Epidural eventually  #FWB: Cat I #GBS negative #Patient with a history of hemorrhagic stroke in 2018 after using adderall off the street. Denies drug use this pregnancy. Hx of gastric bypass surgery. BMI 40 currently.   Chauncey Mann, MD 1:07 PM

## 2018-10-26 NOTE — Anesthesia Procedure Notes (Signed)
Epidural Patient location during procedure: OB Start time: 10/26/2018 5:55 PM End time: 10/26/2018 5:59 PM  Staffing Anesthesiologist: Lyn Hollingshead, MD Performed: anesthesiologist   Preanesthetic Checklist Completed: patient identified, site marked, surgical consent, pre-op evaluation, timeout performed, IV checked, risks and benefits discussed and monitors and equipment checked  Epidural Patient position: sitting Prep: site prepped and draped and DuraPrep Patient monitoring: continuous pulse ox and blood pressure Approach: midline Location: L3-L4 Injection technique: LOR air  Needle:  Needle type: Tuohy  Needle gauge: 17 G Needle length: 9 cm and 9 Needle insertion depth: 7 cm Catheter type: closed end flexible Catheter size: 19 Gauge Catheter at skin depth: 12 cm Test dose: negative and Other  Assessment Events: blood not aspirated, injection not painful, no injection resistance, negative IV test and no paresthesia  Additional Notes Reason for block:procedure for pain

## 2018-10-26 NOTE — Progress Notes (Signed)
Spoke with Dr. Jillyn Hidden about pt hx of stroke.

## 2018-10-27 ENCOUNTER — Encounter (HOSPITAL_COMMUNITY): Payer: Self-pay | Admitting: Anesthesiology

## 2018-10-27 ENCOUNTER — Encounter (HOSPITAL_COMMUNITY)
Admission: AD | Disposition: A | Discharge status: Home / Self Care | Payer: Self-pay | Source: Home / Self Care | Attending: Obstetrics & Gynecology

## 2018-10-27 ENCOUNTER — Inpatient Hospital Stay (HOSPITAL_COMMUNITY): Payer: Medicaid Other | Admitting: Anesthesiology

## 2018-10-27 LAB — ABO/RH: ABO/RH(D): A POS

## 2018-10-27 SURGERY — LIGATION, FALLOPIAN TUBE, POSTPARTUM
Anesthesia: Choice

## 2018-10-27 SURGERY — LIGATION, FALLOPIAN TUBE, POSTPARTUM
Anesthesia: Epidural

## 2018-10-27 MED ORDER — FENTANYL CITRATE (PF) 100 MCG/2ML IJ SOLN
INTRAMUSCULAR | Status: AC
  Filled 2018-10-27: qty 2

## 2018-10-27 MED ORDER — SODIUM BICARBONATE 8.4 % IV SOLN
INTRAVENOUS | Status: AC
  Filled 2018-10-27: qty 50

## 2018-10-27 MED ORDER — METOCLOPRAMIDE HCL 10 MG PO TABS: 10.0000 mg | ORAL_TABLET | Freq: Once | ORAL | Status: DC

## 2018-10-27 MED ORDER — LIDOCAINE-EPINEPHRINE (PF) 2 %-1:200000 IJ SOLN
INTRAMUSCULAR | Status: AC
  Filled 2018-10-27: qty 10

## 2018-10-27 MED ORDER — LACTATED RINGERS IV SOLN: INTRAVENOUS | Status: DC

## 2018-10-27 MED ORDER — MIDAZOLAM HCL 2 MG/2ML IJ SOLN
INTRAMUSCULAR | Status: AC
  Filled 2018-10-27: qty 2

## 2018-10-27 MED ORDER — ONDANSETRON HCL 4 MG/2ML IJ SOLN
INTRAMUSCULAR | Status: AC
  Filled 2018-10-27: qty 2

## 2018-10-27 MED ORDER — FAMOTIDINE 20 MG PO TABS: 40.0000 mg | ORAL_TABLET | Freq: Once | ORAL | Status: DC

## 2018-10-27 NOTE — Anesthesia Preprocedure Evaluation (Signed)
Anesthesia Evaluation    Reviewed: Allergy & Precautions, H&P , NPO status , Patient's Chart, lab work & pertinent test results  Airway        Dental   Pulmonary           Cardiovascular      Neuro/Psych PSYCHIATRIC DISORDERS Anxiety Depression CVA, No Residual Symptoms    GI/Hepatic Neg liver ROS,   Endo/Other  Morbid obesity  Renal/GU negative Renal ROS     Musculoskeletal   Abdominal (+) + obese,   Peds  Hematology negative hematology ROS (+)   Anesthesia Other Findings   Reproductive/Obstetrics (+) Pregnancy                             Anesthesia Physical  Anesthesia Plan  ASA: III  Anesthesia Plan: Epidural   Post-op Pain Management:    Induction:   PONV Risk Score and Plan: 3 and Ondansetron, Dexamethasone and Scopolamine patch - Pre-op  Airway Management Planned: Nasal Cannula and Natural Airway  Additional Equipment:   Intra-op Plan:   Post-operative Plan:   Informed Consent:   Plan Discussed with: CRNA  Anesthesia Plan Comments:         Anesthesia Quick Evaluation

## 2018-10-27 NOTE — Clinical Social Work Maternal (Signed)
CLINICAL SOCIAL WORK MATERNAL/CHILD NOTE  Patient Details  Name: Dorothy Marquez MRN: [id number] Date of Birth: 29-Aug-1985  Date:  10/27/2018  Clinical Social Worker Initiating Note:  Joelene Millin Long Date/Time: Initiated:  10/27/18/1426     Child's Name:  Gerrit Heck   Biological Parents:  Mother, Father(Father: Erenest Rasher)   Need for Interpreter:  None   Reason for Referral:  Behavioral Health Concerns   Address:  [place]  [id number]    Phone number:  [phone] (home)     Additional phone number:   Household Members/Support Persons (HM/SP):   Household Member/Support Person 1, Household Member/Support Person 2, Household Member/Support Person 3   HM/SP Name Relationship DOB or Age  HM/SP -[place] FOB    HM/SP -[place] son 03/31/07  HM/SP -[place] daughter 10/24/16  HM/SP -4        HM/SP -5        HM/SP -6        HM/SP -7        HM/SP -8          Natural Supports (not living in the home):  Parent   Professional Supports: None   Employment: Disabled   Type of Work:     Education:  [employer] school graduate   Homebound arranged:    Museum/gallery curator Resources:  [employer]   Other Resources:  Physicist, medical , [place] Considerations Which May Impact Care:    Strengths:  Ability to meet basic needs , Home prepared for child , Pediatrician chosen   Psychotropic Medications:         Pediatrician:    [facility] (including [place])  Pediatrician List:   [facility] Other([facility])    Pediatrician Fax Number:    Risk Factors/Current Problems:  Mental Health Concerns    Cognitive State:  Able to Concentrate , Alert , Goal Oriented , Insightful , Linear Thinking    Mood/Affect:  Interested , Comfortable , Relaxed , Calm    CSW Assessment: CSW initially consulted for  anx/dep; [facility] (MOB received prenatal care prior to 28 weeks and had more than 3 visits; "stroke related to OD" (took place in 2018 and no current substance use documented)  CSW met with MOB at bedside to discuss consult for behavioral health concerns. MOB was laying in bed and infant was laying in basinet. CSW introduced self and explained reason for consult. MOB was welcoming, polite and remained engaged during assessment. Infant became fussy, MOB attempted to pick up infant but appeared to be having some difficulty. CSW offered to hand infant to MOB, MOB agreeable. CSW handed infant to MOB, MOB held infant for the duration of the assessment. MOB reported that she resides with FOB and 2 older children. MOB paused when thinking of the birthdays for her two older children. MOB reported that she had a stroke in 2018 that left her with some memory impairment and weakness on her left side. MOB reported that she completed physical therapy and is able to ambulate independently and drive. CSW inquired if MOB having a stroke impacted her parenting, MOB reported yes and she asks her 33 year old to do more things since she had the stroke and it causes her to have some depression. MOB reported that initially after having the stroke she was  not able to drive and take the kids to do things they wanted to do "like go to the zoo". CSW acknowledged and validated MOB's feelings around her health impacting her parenting. CSW inquired about MOB's support system, MOB reported that FOB and her mother are her supports. MOB reported that she receives disability, WIC and food stamps. MOB reported that she has all items needed to care for infant including a car seat and basinet.   CSW inquired about MOB's mental health history, MOB reported that she was diagnosed with depression in high school and has it on and off. MOB reported that the last time she experienced depressive symptoms was a couple of months ago. MOB described her  depressive symptoms as crying, wanting to be alone and not wanting to get out of bed. MOB reported that prior to pregnancy she was on Prozac and plans to restart the medication now that she has gave birth. MOB reported that the medication was helpful and her PCP manages the medication for her. MOB verbalized a plan to contact her PCP to restart her medication. MOB denied any other mental health history. CSW inquired about how MOB was currently feeling, MOB reported that she felt "so happy". MOB reported that she usually feels much better after giving birth. MOB presented calm and had insight on her mental health. MOB did not demonstrate any acute mental health signs/symptoms. CSW assessed for safety, MOB denied SI, HI and domestic violence.   CSW provided education regarding the baby blues period vs. perinatal mood disorders, discussed treatment and gave resources for mental health follow up if concerns arise.  CSW recommends self-evaluation during the postpartum time period using the New Mom Checklist from Postpartum Progress and encouraged MOB to contact a medical professional if symptoms are noted at any time.    CSW provided review of Sudden Infant Death Syndrome (SIDS) precautions.    CSW identifies no further need for intervention and no barriers to discharge at this time.   CSW Plan/Description:  Sudden Infant Death Syndrome (SIDS) Education, Perinatal Mood and Anxiety Disorder (PMADs) Education, No Further Intervention Required/No Barriers to Discharge    Burnis Medin, LCSW 10/27/2018, 2:30 PM

## 2018-10-27 NOTE — Progress Notes (Signed)
Post Partum Day #1 Subjective: no complaints and up ad lib; has been NPO since MN- still desires bilateral tubal ligation; bottlefeeding  Objective: Blood pressure (!) 137/50, pulse 64, temperature 98 F (36.7 C), temperature source Oral, resp. rate 18, height 5\' 7"  (1.702 m), weight 117.9 kg, last menstrual period 01/24/2018, SpO2 100 %, unknown if currently breastfeeding.  Physical Exam:  General: alert, cooperative and no distress Lochia: appropriate Uterine Fundus: firm DVT Evaluation: No evidence of DVT seen on physical exam.  Recent Labs    10/26/18 0738  HGB 13.4  HCT 40.6    Assessment/Plan: -For BTL at 1130 today -Anticipate d/c 10/28/18   LOS: 1 day   Myrtis Ser CNM 10/27/2018, 8:49 AM

## 2018-10-27 NOTE — Progress Notes (Signed)
Faculty Note  Patient is a 33 y.o. [id number] who is PPD#1 s/p SVD.  In to see patient to discuss BTL. After discussion of permanency and risks of regret, she opts to not have BTL done today. Would like Nexplanon placed instead.   Plan for discharge tomorrow.  Feliz Beam, M.D. Attending [place], [facility] for [employer], [place]

## 2018-10-28 DIAGNOSIS — Z30017 Encounter for initial prescription of implantable subdermal contraceptive: Secondary | ICD-10-CM

## 2018-10-28 DIAGNOSIS — Z3009 Encounter for other general counseling and advice on contraception: Secondary | ICD-10-CM

## 2018-10-28 MED ORDER — ETONOGESTREL 68 MG ~~LOC~~ IMPL
68.0000 mg | DRUG_IMPLANT | Freq: Once | SUBCUTANEOUS | Status: AC
  Administered 2018-10-28: 68 mg via SUBCUTANEOUS
  Filled 2018-10-28: qty 1

## 2018-10-28 MED ORDER — ACETAMINOPHEN 325 MG PO TABS: 650.0000 mg | ORAL_TABLET | ORAL | 0 refills | Status: DC | PRN

## 2018-10-28 MED ORDER — LIDOCAINE HCL 1 % IJ SOLN
0.0000 mL | Freq: Once | INTRAMUSCULAR | Status: AC | PRN
  Administered 2018-10-28: 20 mL via INTRADERMAL
  Filled 2018-10-28: qty 20

## 2018-10-28 MED ORDER — POLYETHYLENE GLYCOL 3350 17 GM/SCOOP PO POWD: 17.0000 g | Freq: Every day | ORAL | 1 refills | Status: DC

## 2018-10-28 MED ORDER — IBUPROFEN 600 MG PO TABS: 600.0000 mg | ORAL_TABLET | Freq: Four times a day (QID) | ORAL | 0 refills | Status: DC

## 2018-10-28 NOTE — Procedures (Signed)
Post-Placental Nexplanon Insertion Procedure Note  Patient was identified. Informed consent was signed, signed copy in chart. A time-out was performed.    The insertion site was identified 8-10 cm (3-4 inches) from the medial epicondyle of the humerus and 3-5 cm (1.25-2 inches) posterior to (below) the sulcus (groove) between the biceps and triceps muscles of the patient's LEFT arm and marked. The site was prepped and draped in the usual sterile fashion. Pt was prepped with alcohol swab and then injected with 3 cc of 1% lidocaine. The site was prepped with chlorhexidine due to betadine allergy. Nexplanon removed form packaging,  Device confirmed in needle, then inserted full length of needle and withdrawn per handbook instructions. Provider and patient verified presence of the implant in the woman's arm by palpation. Pt insertion site was covered with steristrips/adhesive bandage and pressure bandage. There was minimal blood loss. Patient tolerated procedure well.  Patient was given post procedure instructions and Nexplanon user card with expiration date. Condoms were recommended for STI prevention. Patient was asked to keep the pressure dressing on for 24 hours to minimize bruising and keep the adhesive bandage on for 3-5 days. The patient verbalized understanding of the plan of care and agrees.   Lot # [id number]  Expiration Date11/03/2020

## 2018-10-28 NOTE — Anesthesia Postprocedure Evaluation (Signed)
Anesthesia Post Note  Patient: Dorothy Marquez  Procedure(s) Performed: AN AD HOC LABOR EPIDURAL     Patient location during evaluation: Mother Baby Anesthesia Type: Epidural Level of consciousness: awake and alert, oriented and patient cooperative Pain management: pain level controlled Vital Signs Assessment: post-procedure vital signs reviewed and stable Respiratory status: spontaneous breathing Cardiovascular status: stable Postop Assessment: no headache, epidural receding, patient able to bend at knees, no signs of nausea or vomiting and able to ambulate Anesthetic complications: no Comments: Pt. Is walking.  States pain score is 1.     Last Vitals:  Vitals:   10/27/18 1500 10/28/18 0537  BP: 133/85 111/68  Pulse: 77 61  Resp: 18   Temp: 36.6 C 36.9 C  SpO2:  100%    Last Pain:  Vitals:   10/28/18 0730  TempSrc:   PainSc: 0-No pain   Pain Goal: Patients Stated Pain Goal: 9 (10/26/18 1345)                 Rico Sheehan

## 2018-10-28 NOTE — Discharge Instructions (Signed)

## 2018-11-05 ENCOUNTER — Telehealth: Payer: Self-pay | Admitting: *Deleted

## 2018-11-05 NOTE — Telephone Encounter (Signed)
-----   Message from Chauncey Mann, MD sent at 10/26/2018  7:43 PM EDT ----- Regarding: Post Partum Appt Please schedule this patient for Postpartum visit in: 4 weeks with the following provider: Any provider High risk pregnancy complicated by: IUGR Delivery mode:  SVD Anticipated Birth Control:  BTL done PP PP Procedures needed: None  Schedule Integrated [place] visit: yes  Thanks!  [place]

## 2018-11-05 NOTE — Telephone Encounter (Signed)
Left URGENT message to call and schedule 4 week Postpartum appointment from 10/26/2018 delivery date. [facility] message was sent and not read.

## 2018-11-09 NOTE — Telephone Encounter (Signed)
Left patient a second URGENT message to call and schedule 4 week Postpartum appointment.

## 2018-11-23 ENCOUNTER — Encounter: Payer: Self-pay | Admitting: Certified Nurse Midwife

## 2018-11-23 ENCOUNTER — Telehealth (INDEPENDENT_AMBULATORY_CARE_PROVIDER_SITE_OTHER): Payer: Medicaid Other | Admitting: Certified Nurse Midwife

## 2018-11-23 ENCOUNTER — Other Ambulatory Visit: Payer: Self-pay

## 2018-11-23 DIAGNOSIS — O09899 Supervision of other high risk pregnancies, unspecified trimester: Secondary | ICD-10-CM

## 2018-11-23 NOTE — Progress Notes (Signed)
TELEHEALTH POSTPARTUM VIRTUAL VIDEO VISIT ENCOUNTER NOTE   Provider location: Center for [employer] at [place] connected with Sammie Bench on 11/23/18 at  9:30 AM EDT by MyChart Video Encounter at home and verified that I am speaking with the correct person using two identifiers.    I discussed the limitations, risks, security and privacy concerns of performing an evaluation and management service virtually and the availability of in person appointments. I also discussed with the patient that there may be a patient responsible charge related to this service. The patient expressed understanding and agreed to proceed.  Chief Complaint: Postpartum Visit  History of Present Illness: Dorothy Marquez is a 33 y.o. Caucasian [id number] being evaluated for postpartum followup.    She is s/p normal spontaneous vaginal delivery on 37 at 3 weeks; she was discharged to home on D#2. 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 Baby is doing well.  Complains of none  Vaginal bleeding or discharge: No  Intercourse: No  Contraception: nexplanon- placed in hospital Mode of feeding infant: Bottle PP depression s/s: No .  Any bowel or bladder issues: No  Pap smear: no abnormalities (date: 06/07/18)  Review of Systems: Positive for none. Her 12 point review of systems is negative or as noted in the History of Present Illness.  Patient Active Problem List   Diagnosis Date Noted  . Unwanted fertility   . Indication for care in labor or delivery 10/26/2018  . History of CVA with residual deficit 10/26/2018  . BMI 40.0-44.9, adult ([place]) 10/26/2018  . Obesity in pregnancy 06/07/2018  . CVA (cerebral vascular accident) ([employer]) 06/07/2018  . 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 Nontraumatic subcortical hemorrhage of right cerebral hemisphere ([place]) 11/11/2016  . Dysphagia 11/07/2016  . Intraparenchymal hemorrhage of brain ([place]) 11/06/2016  . PTSD  (post-traumatic stress disorder) 03/03/2016  . Episodic tension-type headache, not intractable 10/08/2015  . Mild episode of recurrent major depressive disorder ([place]) 05/25/2015  . GAD (generalized anxiety disorder) 05/25/2015  . Mood disorder ([place]) 05/25/2015  . Inattention 05/25/2015  . Need for prophylactic vaccination and inoculation against influenza 05/25/2015  . History of gastric bypass 05/25/2015  . Vitamin D deficiency 05/25/2015  . Folate deficiency 05/25/2015  . Thiamine deficiency 05/25/2015  . Mild obstructive sleep apnea 08/28/2014  . Dyslipidemia 08/26/2014  . 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 Essential (primary) hypertension 08/08/2013  . Clinical depression 05/30/2013  . HLD (hyperlipidemia) 08/28/2012  . Obstructive apnea 08/28/2012    Medications Darleth Encarnacion had no medications administered during this visit. Current Outpatient Medications  Medication Sig Dispense Refill  . acetaminophen (TYLENOL) 325 MG tablet Take 2 tablets (650 mg total) by mouth every 4 (four) hours as needed (for pain scale < 4). (Patient not taking: Reported on 11/23/2018) 30 tablet 0  . ibuprofen (ADVIL) 600 MG tablet Take 1 tablet (600 mg total) by mouth every 6 (six) hours. (Patient not taking: Reported on 11/23/2018) 30 tablet 0  . polyethylene glycol powder (GLYCOLAX) 17 GM/SCOOP powder Take 17 g by mouth daily. (Patient not taking: Reported on 11/23/2018) 255 g 1  . Prenatal Vit-Fe Fumarate-FA (PRENATAL VITAMINS) 28-0.8 MG TABS Take 1 tablet by mouth daily. (Patient not taking: Reported on 11/23/2018) 30 tablet 8  . Vitamin D, Ergocalciferol, (DRISDOL) 1.25 MG (50000 UT) CAPS capsule Take 1 capsule (50,000 Units total) by mouth every 7 (seven) days. (Patient not taking: Reported on 10/23/2018) 16 capsule 0  No current facility-administered medications for this visit.     Allergies Iodinated diagnostic agents  Physical Exam:  Breastfeeding No   General:  Alert, oriented  and cooperative. Patient is in no acute distress.  Mental Status: Normal mood and affect. Normal behavior. Normal judgment and thought content.   Respiratory: Normal respiratory effort noted, no problems with respiration noted  Rest of physical exam deferred due to type of encounter  PP Depression Screening:   Edinburgh Postnatal Depression Scale Screening Tool 11/23/2018 10/28/2018 10/28/2018  I have been able to laugh and see the funny side of things. 0 1 (No Data)  I have looked forward with enjoyment to things. 0 1 -  I have blamed myself unnecessarily when things went wrong. 0 1 -  I have been anxious or worried for no good reason. 0 2 -  I have felt scared or panicky for no good reason. 0 1 -  Things have been getting on top of me. 0 0 -  I have been so unhappy that I have had difficulty sleeping. 0 0 -  I have felt sad or miserable. 0 0 -  I have been so unhappy that I have been crying. 0 1 -  The thought of harming myself has occurred to me. 0 0 -  Edinburgh Postnatal Depression Scale Total 0 7 -     Assessment: S/p SVD Hx of CVA  Plan: Follow up soon with Neuro- [facility])- pt to schedule RTC next year or prn  I discussed the assessment and treatment plan with the patient. The patient was provided an opportunity to ask questions and all were answered. The patient agreed with the plan and demonstrated an understanding of the instructions.   The patient was advised to call back or seek an in-person evaluation/go to the ED for any concerning postpartum symptoms.  I provided 6 minutes of face-to-face time during this encounter.   Donette LarryMelanie Jamarie [place], CNM Center for [facility], [facility]

## 2019-03-26 ENCOUNTER — Encounter: Payer: Self-pay | Admitting: Osteopathic Medicine

## 2019-03-27 ENCOUNTER — Encounter: Payer: Self-pay | Admitting: Osteopathic Medicine

## 2019-03-27 ENCOUNTER — Other Ambulatory Visit: Payer: Self-pay

## 2019-03-27 ENCOUNTER — Ambulatory Visit (INDEPENDENT_AMBULATORY_CARE_PROVIDER_SITE_OTHER): Payer: Medicare HMO | Admitting: Osteopathic Medicine

## 2019-03-27 VITALS — Wt 240.0 lb

## 2019-03-27 DIAGNOSIS — F330 Major depressive disorder, recurrent, mild: Secondary | ICD-10-CM | POA: Diagnosis not present

## 2019-03-27 DIAGNOSIS — I6930 Unspecified sequelae of cerebral infarction: Secondary | ICD-10-CM | POA: Diagnosis not present

## 2019-03-27 DIAGNOSIS — F411 Generalized anxiety disorder: Secondary | ICD-10-CM | POA: Diagnosis not present

## 2019-03-27 MED ORDER — FLUOXETINE HCL 20 MG PO CAPS: ORAL_CAPSULE | ORAL | 0 refills | Status: DC

## 2019-03-27 NOTE — Progress Notes (Signed)
Virtual Visit via Video (App used: [employer]imit) Note  I connected with      Dorothy Marquez on 03/27/19 at 1:21 PM  by a telemedicine application and verified that I am speaking with the correct person using two identifiers.  Patient is at home I am in office   I discussed the limitations of evaluation and management by telemedicine and the availability of in person appointments. The patient expressed understanding and agreed to proceed.  History of Present Illness: Dorothy Marquez is a 34 y.o. female who would like to discuss getting abck onher medications. Was doing well on Prozac but since it's ben out has struggled w/ anxiety/depression increasing      Depression screen [facility] 2/9 03/27/2019 03/29/2018 04/11/2017  Decreased Interest 0 1 2  Down, Depressed, Hopeless 2 1 2   PHQ - 2 Score 2 2 4   Altered sleeping 2 2 1   Tired, decreased energy 3 2 3   Change in appetite 0 2 2  Feeling bad or failure about yourself  0 1 0  Trouble concentrating 2 1 2   Moving slowly or fidgety/restless 0 1 1  Suicidal thoughts 0 0 0  PHQ-9 Score 9 11 13   Difficult doing work/chores Somewhat difficult - -   GAD 7 : Generalized Anxiety Score 03/27/2019 03/29/2018 04/11/2017 06/15/2015  Nervous, Anxious, on Edge 3 3 2 3   Control/stop worrying 3 2 1 2   Worry too much - different things 3 2 2 2   Trouble relaxing 3 2 1 3   Restless 0 1 2 2   Easily annoyed or irritable 2 1 2 3   Afraid - awful might happen 3 1 0 2  Total GAD 7 Score 17 12 10 17   Anxiety Difficulty Somewhat difficult - - Extremely difficult       Observations/Objective: Wt 240 lb (108.9 kg)   LMP 03/21/2019   BMI 37.59 kg/m  BP Readings from Last 3 Encounters:  10/28/18 111/68  10/23/18 111/76  10/23/18 110/79   Exam: Normal Speech.  nad  Lab and Radiology Results No results found for this or any previous visit (from the past 72 hour(s)). No results found.     Assessment and Plan: 34 y.o. female with The primary encounter  diagnosis was GAD (generalized anxiety disorder). Diagnoses of Mild episode of recurrent major depressive disorder (HCC) and History of CVA with residual deficit were also pertinent to this visit.   PDMP not reviewed this encounter. Orders Placed This Encounter  Procedures  . CBC  . COMPLETE METABOLIC PANEL WITH GFR  . Lipid panel  . TSH   Meds ordered this encounter  Medications  . FLUoxetine (PROZAC) 20 MG capsule    Sig: Take 1 capsule (20 mg total) by mouth daily for 14 days, THEN 2 capsules (40 mg total) daily for 16 days.    Dispense:  46 capsule    Refill:  0   There are no Patient Instructions on file for this visit.    Follow Up Instructions: Return in about 2 weeks (around 04/10/2019) for VIRTUAL VISIT TO RECHECK MENTAL HEALTH ON NEW MEDICATION.    I discussed the assessment and treatment plan with the patient. The patient was provided an opportunity to ask questions and all were answered. The patient agreed with the plan and demonstrated an understanding of the instructions.   The patient was advised to call back or seek an in-person evaluation if any new concerns, if symptoms worsen or if the condition fails to improve as  anticipated.  20 minutes of non-face-to-face time was provided during this encounter.      . . . . . . . . . . . . . Marland Kitchen                   Historical information moved to improve visibility of documentation.  Past Medical History:  Diagnosis Date  . Anxiety   . Depression   . History of hiatal hernia   . History of kidney stones    x5 episodes  . Stroke [facility])    Past Surgical History:  Procedure Laterality Date  . LAPAROSCOPIC GASTRIC SLEEVE RESECTION WITH HIATAL HERNIA REPAIR N/A 08/25/2014   Procedure: LAPAROSCOPIC GASTRIC SLEEVE RESECTION WITH HIATAL HERNIA REPAIR;  Surgeon: Greer Pickerel, MD;  Location: [facility]S;  Service: General;  Laterality: N/A;  . UPPER GI ENDOSCOPY N/A 08/25/2014   Procedure: UPPER GI  ENDOSCOPY;  Surgeon: Greer Pickerel, MD;  Location: [facility];  Service: General;  Laterality: N/A;  . WISDOM TOOTH EXTRACTION     Social History   Tobacco Use  . Smoking status: Never Smoker  . Smokeless tobacco: Never Used  Substance Use Topics  . Alcohol use: Yes    Comment: rare- social   family history includes Alcohol abuse in her maternal uncle; Cancer in her maternal aunt; Depression in her brother, maternal aunt, and mother; Diabetes in her maternal aunt and mother; Hyperlipidemia in her brother.  Medications: Current Outpatient Medications  Medication Sig Dispense Refill  . acetaminophen (TYLENOL) 325 MG tablet Take 2 tablets (650 mg total) by mouth every 4 (four) hours as needed (for pain scale < 4). (Patient not taking: Reported on 11/23/2018) 30 tablet 0  . FLUoxetine (PROZAC) 20 MG capsule Take 1 capsule (20 mg total) by mouth daily for 14 days, THEN 2 capsules (40 mg total) daily for 16 days. 46 capsule 0  . ibuprofen (ADVIL) 600 MG tablet Take 1 tablet (600 mg total) by mouth every 6 (six) hours. (Patient not taking: Reported on 11/23/2018) 30 tablet 0  . polyethylene glycol powder (GLYCOLAX) 17 GM/SCOOP powder Take 17 g by mouth daily. (Patient not taking: Reported on 11/23/2018) 255 g 1  . Prenatal Vit-Fe Fumarate-FA (PRENATAL VITAMINS) 28-0.8 MG TABS Take 1 tablet by mouth daily. (Patient not taking: Reported on 11/23/2018) 30 tablet 8  . Vitamin D, Ergocalciferol, (DRISDOL) 1.25 MG (50000 UT) CAPS capsule Take 1 capsule (50,000 Units total) by mouth every 7 (seven) days. (Patient not taking: Reported on 10/23/2018) 16 capsule 0   No current facility-administered medications for this visit.   Allergies  Allergen Reactions  . Iodinated Diagnostic Agents Hives

## 2019-04-02 ENCOUNTER — Encounter: Payer: Self-pay | Admitting: Osteopathic Medicine

## 2019-04-06 LAB — LIPID PANEL
Cholesterol: 158 mg/dL (ref <200)
HDL: 57 mg/dL (ref >=50)
LDL Cholesterol (Calc): 87 mg/dL (calc)
Non-HDL Cholesterol (Calc): 101 mg/dL (calc) (ref <130)
Total CHOL/HDL Ratio: 2.8 (calc) (ref <5.0)
Triglycerides: 62 mg/dL (ref <150)

## 2019-04-06 LAB — COMPLETE METABOLIC PANEL WITH GFR
AG Ratio: 1.4 (calc) (ref 1.0–2.5)
ALT: 9 U/L (ref 6–29)
AST: 12 U/L (ref 10–30)
Albumin: 3.9 g/dL (ref 3.6–5.1)
Alkaline phosphatase (APISO): 88 U/L (ref 31–125)
BUN: 7 mg/dL (ref 7–25)
CO2: 23 mmol/L (ref 20–32)
Calcium: 9.4 mg/dL (ref 8.6–10.2)
Chloride: 107 mmol/L (ref 98–110)
Creat: 0.63 mg/dL (ref 0.50–1.10)
GFR, Est African American: 137 mL/min/{1.73_m2} (ref >=60)
GFR, Est Non African American: 118 mL/min/{1.73_m2} (ref >=60)
Globulin: 2.7 g/dL (calc) (ref 1.9–3.7)
Glucose, Bld: 78 mg/dL (ref 65–139)
Potassium: 4.3 mmol/L (ref 3.5–5.3)
Sodium: 139 mmol/L (ref 135–146)
Total Bilirubin: 0.8 mg/dL (ref 0.2–1.2)
Total Protein: 6.6 g/dL (ref 6.1–8.1)

## 2019-04-06 LAB — CBC
HCT: 42.4 % (ref 35.0–45.0)
Hemoglobin: 13.8 g/dL (ref 11.7–15.5)
MCH: 30.7 pg (ref 27.0–33.0)
MCHC: 32.5 g/dL (ref 32.0–36.0)
MCV: 94.2 fL (ref 80.0–100.0)
MPV: 11.9 fL (ref 7.5–12.5)
Platelets: 328 10*3/uL (ref 140–400)
RBC: 4.5 10*6/uL (ref 3.80–5.10)
RDW: 11.7 % (ref 11.0–15.0)
WBC: 6.3 10*3/uL (ref 3.8–10.8)

## 2019-04-06 LAB — TSH: TSH: 2.79 mIU/L

## 2019-04-11 ENCOUNTER — Telehealth (INDEPENDENT_AMBULATORY_CARE_PROVIDER_SITE_OTHER): Payer: Medicare HMO | Admitting: Osteopathic Medicine

## 2019-04-11 ENCOUNTER — Encounter: Payer: Self-pay | Admitting: Osteopathic Medicine

## 2019-04-11 VITALS — Wt 260.0 lb

## 2019-04-11 DIAGNOSIS — F411 Generalized anxiety disorder: Secondary | ICD-10-CM

## 2019-04-11 DIAGNOSIS — F331 Major depressive disorder, recurrent, moderate: Secondary | ICD-10-CM

## 2019-04-11 MED ORDER — FLUOXETINE HCL 40 MG PO CAPS: 40.0000 mg | ORAL_CAPSULE | Freq: Every day | ORAL | 0 refills | Status: DC

## 2019-04-11 NOTE — Progress Notes (Signed)
Virtual Visit via Video (App used: Doximity) Note  I connected with      Dorothy Marquez on 04/11/19 at 2:54 PM  by a telemedicine application and verified that I am speaking with the correct person using two identifiers.  Patient is at home I am in office   I discussed the limitations of evaluation and management by telemedicine and the availability of in person appointments. The patient expressed understanding and agreed to proceed.  History of Present Illness: Dorothy Marquez is a 34 y.o. female who would like to discuss mental health  Last visit 03/27/2019 we restarted Prozac to titrate fairly quickly back up to 40 mg, pervious dose. She reports overall she is feeling much better since starting the 20 mg, she is not up to 40 mg yet, feels like there is some room for improvement.    Depression screen [facility] 2/9 04/11/2019 03/27/2019 03/29/2018  Decreased Interest 2 0 1  Down, Depressed, Hopeless 2 2 1   PHQ - 2 Score 4 2 2   Altered sleeping 3 2 2   Tired, decreased energy 3 3 2   Change in appetite 0 0 2  Feeling bad or failure about yourself  0 0 1  Trouble concentrating 2 2 1   Moving slowly or fidgety/restless 0 0 1  Suicidal thoughts 0 0 0  PHQ-9 Score 12 9 11   Difficult doing work/chores Somewhat difficult Somewhat difficult -   GAD 7 : Generalized Anxiety Score 04/11/2019 03/27/2019 03/29/2018 04/11/2017  Nervous, Anxious, on Edge 2 3 3 2   Control/stop worrying 2 3 2 1   Worry too much - different things 2 3 2 2   Trouble relaxing 2 3 2 1   Restless 0 0 1 2  Easily annoyed or irritable 2 2 1 2   Afraid - awful might happen 2 3 1  0  Total GAD 7 Score 12 17 12 10   Anxiety Difficulty Somewhat difficult Somewhat difficult - -        Observations/Objective: Wt 260 lb (117.9 kg)   LMP 03/21/2019   BMI 40.72 kg/m  BP Readings from Last 3 Encounters:  10/28/18 111/68  10/23/18 111/76  10/23/18 110/79   Exam: Normal Speech.  NAD  Lab and Radiology Results No results found for  this or any previous visit (from the past 72 hour(s)). No results found.     Assessment and Plan: 34 y.o. female with The primary encounter diagnosis was Moderate episode of recurrent major depressive disorder ([place]). A diagnosis of GAD (generalized anxiety disorder) was also pertinent to this visit.  OK to go up to 40 mg as planned GAD/PHQ no drastic different but pt reports significant improvement   PDMP not reviewed this encounter. No orders of the defined types were placed in this encounter.  Meds ordered this encounter  Medications  . FLUoxetine (PROZAC) 40 MG capsule    Sig: Take 1 capsule (40 mg total) by mouth daily.    Dispense:  90 capsule    Refill:  0     Follow Up Instructions: Return in about 4 weeks (around 05/09/2019) for VIRTUAL VISIT - [place] .    I discussed the assessment and treatment plan with the patient. The patient was provided an opportunity to ask questions and all were answered. The patient agreed with the plan and demonstrated an understanding of the instructions.   The patient was advised to call back or seek an in-person evaluation if any new concerns, if symptoms worsen or if the condition  fails to improve as anticipated.  15 minutes of non-face-to-face time was provided during this encounter.      . . . . . . . . . . . . . Marland Kitchen                   Historical information moved to improve visibility of documentation.  Past Medical History:  Diagnosis Date  . Anxiety   . Depression   . History of hiatal hernia   . History of kidney stones    x5 episodes  . Stroke [facility])    Past Surgical History:  Procedure Laterality Date  . LAPAROSCOPIC GASTRIC SLEEVE RESECTION WITH HIATAL HERNIA REPAIR N/A 08/25/2014   Procedure: LAPAROSCOPIC GASTRIC SLEEVE RESECTION WITH HIATAL HERNIA REPAIR;  Surgeon: Gaynelle Adu, MD;  Location: [facility];  Service: General;  Laterality: N/A;  . UPPER GI ENDOSCOPY N/A 08/25/2014    Procedure: UPPER GI ENDOSCOPY;  Surgeon: Gaynelle Adu, MD;  Location: [facility];  Service: General;  Laterality: N/A;  . WISDOM TOOTH EXTRACTION     Social History   Tobacco Use  . Smoking status: Never Smoker  . Smokeless tobacco: Never Used  Substance Use Topics  . Alcohol use: Yes    Comment: rare- social   family history includes Alcohol abuse in her maternal uncle; Cancer in her maternal aunt; Depression in her brother, maternal aunt, and mother; Diabetes in her maternal aunt and mother; Hyperlipidemia in her brother.  Medications: Current Outpatient Medications  Medication Sig Dispense Refill  . FLUoxetine (PROZAC) 40 MG capsule Take 1 capsule (40 mg total) by mouth daily. 90 capsule 0  . acetaminophen (TYLENOL) 325 MG tablet Take 2 tablets (650 mg total) by mouth every 4 (four) hours as needed (for pain scale < 4). (Patient not taking: Reported on 11/23/2018) 30 tablet 0  . ibuprofen (ADVIL) 600 MG tablet Take 1 tablet (600 mg total) by mouth every 6 (six) hours. (Patient not taking: Reported on 11/23/2018) 30 tablet 0  . polyethylene glycol powder (GLYCOLAX) 17 GM/SCOOP powder Take 17 g by mouth daily. (Patient not taking: Reported on 11/23/2018) 255 g 1  . Prenatal Vit-Fe Fumarate-FA (PRENATAL VITAMINS) 28-0.8 MG TABS Take 1 tablet by mouth daily. (Patient not taking: Reported on 11/23/2018) 30 tablet 8  . Vitamin D, Ergocalciferol, (DRISDOL) 1.25 MG (50000 UT) CAPS capsule Take 1 capsule (50,000 Units total) by mouth every 7 (seven) days. (Patient not taking: Reported on 10/23/2018) 16 capsule 0   No current facility-administered medications for this visit.   Allergies  Allergen Reactions  . Iodinated Diagnostic Agents Hives

## 2019-04-12 NOTE — Telephone Encounter (Signed)
Appointment has been made. No further questions at this time.  

## 2019-04-12 NOTE — Telephone Encounter (Signed)
-----   Message from Sunnie Nielsen, DO sent at 04/11/2019  3:03 PM EST ----- Follow Up Instructions: Return in about 4 weeks (around 05/09/2019) for VIRTUAL VISIT - RECHECK MENTAL HEALTH .

## 2019-05-06 ENCOUNTER — Encounter: Payer: Self-pay | Admitting: Osteopathic Medicine

## 2019-05-09 ENCOUNTER — Telehealth (INDEPENDENT_AMBULATORY_CARE_PROVIDER_SITE_OTHER): Payer: Medicare HMO | Admitting: Osteopathic Medicine

## 2019-05-09 ENCOUNTER — Encounter: Payer: Self-pay | Admitting: Osteopathic Medicine

## 2019-05-09 VITALS — Wt 250.0 lb

## 2019-05-09 DIAGNOSIS — F411 Generalized anxiety disorder: Secondary | ICD-10-CM | POA: Diagnosis not present

## 2019-05-09 DIAGNOSIS — F4310 Post-traumatic stress disorder, unspecified: Secondary | ICD-10-CM | POA: Diagnosis not present

## 2019-05-09 DIAGNOSIS — F329 Major depressive disorder, single episode, unspecified: Secondary | ICD-10-CM | POA: Diagnosis not present

## 2019-05-09 MED ORDER — FLUOXETINE HCL 20 MG PO CAPS: 20.0000 mg | ORAL_CAPSULE | Freq: Every day | ORAL | 0 refills | Status: DC

## 2019-05-09 MED ORDER — CLOTRIMAZOLE-BETAMETHASONE 1-0.05 % EX CREA: 1.0000 "application " | TOPICAL_CREAM | Freq: Two times a day (BID) | CUTANEOUS | 0 refills | Status: DC

## 2019-05-09 MED ORDER — BUSPIRONE HCL 7.5 MG PO TABS: 7.5000 mg | ORAL_TABLET | Freq: Two times a day (BID) | ORAL | 0 refills | Status: DC

## 2019-05-09 NOTE — Progress Notes (Signed)
Called pt at 120 pm, no answer. Left a vm msg for pt.

## 2019-05-09 NOTE — Progress Notes (Signed)
Virtual Visit via Video (App used: Doximity) Note  I connected with      Dorothy Marquez on 05/09/19 at 2:54 PM  by a telemedicine application and verified that I am speaking with the correct person using two identifiers.  Patient is at home I am in office   I discussed the limitations of evaluation and management by telemedicine and the availability of in person appointments. The patient expressed understanding and agreed to proceed.  History of Present Illness: Dorothy Marquez is a 34 y.o. female who would like to discuss mental health   Initial visit for this issue 03/27/2019 we restarted Prozac to titrate fairly quickly back up to 40 mg, pervious dose.   Last visit 04/11/2019: She reported overall she is feeling much better since starting the 20 mg, she is not up to 40 mg yet, feels like there is some room for improvement. I advised going up to 40 mg Prozac.   Today 05/09/19: reports still feeling depression but more so anxiety    Depression screen [facility] 2/9 05/09/2019 04/11/2019 03/27/2019  Decreased Interest 3 2 0  Down, Depressed, Hopeless 0 2 2  PHQ - 2 Score 3 4 2   Altered sleeping 2 3 2   Tired, decreased energy 2 3 3   Change in appetite 0 0 0  Feeling bad or failure about yourself  0 0 0  Trouble concentrating 2 2 2   Moving slowly or fidgety/restless 2 0 0  Suicidal thoughts 0 0 0  PHQ-9 Score 11 12 9   Difficult doing work/chores Somewhat difficult Somewhat difficult Somewhat difficult   GAD 7 : Generalized Anxiety Score 05/09/2019 04/11/2019 03/27/2019 03/29/2018  Nervous, Anxious, on Edge 1 2 3 3   Control/stop worrying 1 2 3 2   Worry too much - different things 1 2 3 2   Trouble relaxing 2 2 3 2   Restless 2 0 0 1  Easily annoyed or irritable 1 2 2 1   Afraid - awful might happen 2 2 3 1   Total GAD 7 Score 10 12 17 12   Anxiety Difficulty Somewhat difficult Somewhat difficult Somewhat difficult -        Observations/Objective: Wt 250 lb (113.4 kg)   BMI 39.16 kg/m  BP  Readings from Last 3 Encounters:  10/28/18 111/68  10/23/18 111/76  10/23/18 110/79   Exam: Normal Speech.  NAD  Lab and Radiology Results No results found for this or any previous visit (from the past 72 hour(s)). No results found.     Assessment and Plan: 34 y.o. female with The primary encounter diagnosis was GAD (generalized anxiety disorder). Diagnoses of PTSD (post-traumatic stress disorder) and Depression, unspecified depression type were also pertinent to this visit.  Increase Prozac to 60 mg daily  BuSpar added back   PDMP not reviewed this encounter. No orders of the defined types were placed in this encounter.  Meds ordered this encounter  Medications  . busPIRone (BUSPAR) 7.5 MG tablet    Sig: Take 1-2 tablets (7.5-15 mg total) by mouth 2 (two) times daily.    Dispense:  90 tablet    Refill:  0  . FLUoxetine (PROZAC) 20 MG capsule    Sig: Take 1 capsule (20 mg total) by mouth daily. 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    Dispense:  30 capsule    Refill:  0  . clotrimazole-betamethasone (LOTRISONE) cream    Sig: Apply 1 application topically 2 (two) times daily.    Dispense:  45 g    Refill:  0     Follow Up Instructions: Return in about 4 weeks (around 06/06/2019) for VIRTUAL VISIT recheck mental heath, can schedule office visit if we need to look at rash .    I discussed the assessment and treatment plan with the patient. The patient was provided an opportunity to ask questions and all were answered. The patient agreed with the plan and demonstrated an understanding of the instructions.   The patient was advised to call back or seek an in-person evaluation if any new concerns, if symptoms worsen or if the condition fails to improve as anticipated.  15 minutes of non-face-to-face time was provided during this encounter.      . . . . . . . . . . . . . Marland Kitchen                   Historical information moved to improve  visibility of documentation.  Past Medical History:  Diagnosis Date  . Anxiety   . Depression   . History of hiatal hernia   . History of kidney stones    x5 episodes  . Stroke [facility])    Past Surgical History:  Procedure Laterality Date  . LAPAROSCOPIC GASTRIC SLEEVE RESECTION WITH HIATAL HERNIA REPAIR N/A 08/25/2014   Procedure: LAPAROSCOPIC GASTRIC SLEEVE RESECTION WITH HIATAL HERNIA REPAIR;  Surgeon: Gaynelle Adu, MD;  Location: [facility];  Service: General;  Laterality: N/A;  . UPPER GI ENDOSCOPY N/A 08/25/2014   Procedure: UPPER GI ENDOSCOPY;  Surgeon: Gaynelle Adu, MD;  Location: [facility];  Service: General;  Laterality: N/A;  . WISDOM TOOTH EXTRACTION     Social History   Tobacco Use  . Smoking status: Never Smoker  . Smokeless tobacco: Never Used  Substance Use Topics  . Alcohol use: Yes    Comment: rare- social   family history includes Alcohol abuse in her maternal uncle; Cancer in her maternal aunt; Depression in her brother, maternal aunt, and mother; Diabetes in her maternal aunt and mother; Hyperlipidemia in her brother.  Medications: Current Outpatient Medications  Medication Sig Dispense Refill  . FLUoxetine (PROZAC) 40 MG capsule Take 1 capsule (40 mg total) by mouth daily. 90 capsule 0  . acetaminophen (TYLENOL) 325 MG tablet Take 2 tablets (650 mg total) by mouth every 4 (four) hours as needed (for pain scale < 4). (Patient not taking: Reported on 11/23/2018) 30 tablet 0  . busPIRone (BUSPAR) 7.5 MG tablet Take 1-2 tablets (7.5-15 mg total) by mouth 2 (two) times daily. 90 tablet 0  . clotrimazole-betamethasone (LOTRISONE) cream Apply 1 application topically 2 (two) times daily. 45 g 0  . FLUoxetine (PROZAC) 20 MG capsule Take 1 capsule (20 mg total) by mouth daily. 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 30 capsule 0  . ibuprofen (ADVIL) 600 MG tablet Take 1 tablet (600 mg total) by mouth every 6 (six) hours. (Patient not taking: Reported on 11/23/2018) 30 tablet 0  .  polyethylene glycol powder (GLYCOLAX) 17 GM/SCOOP powder Take 17 g by mouth daily. (Patient not taking: Reported on 11/23/2018) 255 g 1  . Prenatal Vit-Fe Fumarate-FA (PRENATAL VITAMINS) 28-0.8 MG TABS Take 1 tablet by mouth daily. (Patient not taking: Reported on 11/23/2018) 30 tablet 8  . Vitamin D, Ergocalciferol, (DRISDOL) 1.25 MG (50000 UT) CAPS capsule Take 1 capsule (50,000 Units total) by mouth every 7 (seven) days. (Patient not taking: Reported on 10/23/2018) 16 capsule 0   No  current facility-administered medications for this visit.   Allergies  Allergen Reactions  . Iodinated Diagnostic Agents Hives

## 2019-06-24 ENCOUNTER — Other Ambulatory Visit: Payer: Self-pay

## 2019-06-24 ENCOUNTER — Telehealth: Payer: Self-pay | Admitting: Osteopathic Medicine

## 2019-06-24 MED ORDER — BUSPIRONE HCL 7.5 MG PO TABS: 7.5000 mg | ORAL_TABLET | Freq: Two times a day (BID) | ORAL | 0 refills | Status: DC

## 2019-06-24 MED ORDER — FLUOXETINE HCL 20 MG PO CAPS: 20.0000 mg | ORAL_CAPSULE | Freq: Every day | ORAL | 0 refills | Status: DC

## 2019-06-24 MED ORDER — FLUOXETINE HCL 40 MG PO CAPS: 40.0000 mg | ORAL_CAPSULE | Freq: Every day | ORAL | 0 refills | Status: DC

## 2019-06-24 NOTE — Telephone Encounter (Signed)
Task completed. Refills for buspar and prozac sent to pharmacy on file. Please contact pt with an update. Thanks.   Patient updated via mychart.

## 2019-06-24 NOTE — Telephone Encounter (Signed)
Last AVS:  Return in about 4 weeks (around 06/06/2019) for VIRTUAL VISIT recheck mental heath, can schedule office visit if we need to look at rash .

## 2019-06-24 NOTE — Telephone Encounter (Signed)
Pt sent msg via mychart for refill on depression meds, is she going to need an appointment/can we just all in med for her? Thanks

## 2019-06-24 NOTE — Telephone Encounter (Signed)
Task completed. Refills for buspar and prozac sent to pharmacy on file. Please contact pt with an update. Thanks.

## 2019-09-04 ENCOUNTER — Encounter: Payer: Self-pay | Admitting: Osteopathic Medicine

## 2019-09-04 ENCOUNTER — Ambulatory Visit (INDEPENDENT_AMBULATORY_CARE_PROVIDER_SITE_OTHER): Payer: Medicare HMO | Admitting: Osteopathic Medicine

## 2019-09-04 ENCOUNTER — Other Ambulatory Visit: Payer: Self-pay

## 2019-09-04 VITALS — BP 116/79 | HR 83 | Temp 98.0°F | Wt 264.0 lb

## 2019-09-04 DIAGNOSIS — I6930 Unspecified sequelae of cerebral infarction: Secondary | ICD-10-CM | POA: Diagnosis not present

## 2019-09-04 DIAGNOSIS — G8929 Other chronic pain: Secondary | ICD-10-CM

## 2019-09-04 DIAGNOSIS — M79675 Pain in left toe(s): Secondary | ICD-10-CM

## 2019-09-04 MED ORDER — FLUOXETINE HCL 40 MG PO CAPS: 40.0000 mg | ORAL_CAPSULE | Freq: Every day | ORAL | 3 refills | Status: DC

## 2019-09-04 MED ORDER — BUSPIRONE HCL 7.5 MG PO TABS: 7.5000 mg | ORAL_TABLET | Freq: Two times a day (BID) | ORAL | 3 refills | Status: DC

## 2019-09-04 MED ORDER — FLUOXETINE HCL 20 MG PO CAPS: 20.0000 mg | ORAL_CAPSULE | Freq: Every day | ORAL | 3 refills | Status: DC

## 2019-09-04 NOTE — Progress Notes (Signed)
Dorothy Marquez is a 34 y.o. female who presents to  [facility] at [facility]  today, 09/04/19, seeking care for the following:  . New: Foot pain - L 2nd-5th toes curl plantar direction d/t residual effects of CVA and are contracted and causing pain. On exam, L toes unable to extend other than 1st digit, skin intact and normal temperature/color. . Chronic: Pt also needs refills on other Rx for mental health - stable anxiety/depression improved w/ Rx       ASSESSMENT & PLAN with other pertinent findings:  The primary encounter diagnosis was Chronic toe pain, left foot. A diagnosis of History of cerebrovascular accident (CVA) with residual deficit was also pertinent to this visit.    Patient Instructions  Plan:  Referral to podiatry - they may be able to suggest ways to correct the deformity or lessen its effects (possibly orthotics, surgery or other procedure, etc)  In the meantime, can work on massaging the toes and stretching gradually, might help to loosen the tissues a bit to alleviate the curling.   Check skin frequently for breakdown or infection. Keep toes dry!     Orders Placed This Encounter  Procedures  . Ambulatory referral to Podiatry    Meds ordered this encounter  Medications  . FLUoxetine (PROZAC) 20 MG capsule    Sig: Take 1 capsule (20 mg total) by mouth daily. 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    Dispense:  90 capsule    Refill:  3  . FLUoxetine (PROZAC) 40 MG capsule    Sig: Take 1 capsule (40 mg total) by mouth daily.    Dispense:  90 capsule    Refill:  3  . busPIRone (BUSPAR) 7.5 MG tablet    Sig: Take 1-2 tablets (7.5-15 mg total) by mouth 2 (two) times daily.    Dispense:  90 tablet    Refill:  3       Follow-up instructions: Return for ANNUAL (call week prior to visit for lab orders) DUE AROUND 03/2020.                                         BP 116/79 (BP  Location: Right Arm, Patient Position: Sitting)   Pulse 83   Temp 98 F (36.7 C)   Wt 264 lb (119.7 kg)   SpO2 98%   BMI 41.35 kg/m   Current Meds  Medication Sig  . busPIRone (BUSPAR) 7.5 MG tablet Take 1-2 tablets (7.5-15 mg total) by mouth 2 (two) times daily.  Marland Kitchen FLUoxetine (PROZAC) 20 MG capsule Take 1 capsule (20 mg total) by mouth daily. 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  . FLUoxetine (PROZAC) 40 MG capsule Take 1 capsule (40 mg total) by mouth daily.  . [DISCONTINUED] busPIRone (BUSPAR) 7.5 MG tablet Take 1-2 tablets (7.5-15 mg total) by mouth 2 (two) times daily.  . [DISCONTINUED] FLUoxetine (PROZAC) 20 MG capsule Take 1 capsule (20 mg total) by mouth daily. 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  . [DISCONTINUED] FLUoxetine (PROZAC) 40 MG capsule Take 1 capsule (40 mg total) by mouth daily.    No results found for this or any previous visit (from the past 72 hour(s)).  No results found.     All questions at time of visit were answered - patient instructed to  contact office with any additional concerns or updates.  ER/RTC precautions were reviewed with the patient as applicable.   Please note: voice recognition software was used to produce this document, and typos may escape review. Please contact Dr. Lyn Hollingshead for any needed clarifications.

## 2019-09-04 NOTE — Patient Instructions (Addendum)
Plan:  Referral to podiatry - they may be able to suggest ways to correct the deformity or lessen its effects (possibly orthotics, surgery or other procedure, etc)  In the meantime, can work on massaging the toes and stretching gradually, might help to loosen the tissues a bit to alleviate the curling.   Check skin frequently for breakdown or infection. Keep toes dry!

## 2019-10-04 ENCOUNTER — Telehealth: Payer: Self-pay | Admitting: Podiatry

## 2019-10-04 ENCOUNTER — Encounter: Payer: Self-pay | Admitting: Podiatry

## 2019-10-04 ENCOUNTER — Other Ambulatory Visit: Payer: Self-pay

## 2019-10-04 ENCOUNTER — Ambulatory Visit (INDEPENDENT_AMBULATORY_CARE_PROVIDER_SITE_OTHER): Payer: Medicare HMO | Admitting: Podiatry

## 2019-10-04 VITALS — BP 124/88 | HR 90 | Temp 98.2°F | Resp 16

## 2019-10-04 DIAGNOSIS — M21372 Foot drop, left foot: Secondary | ICD-10-CM | POA: Diagnosis not present

## 2019-10-04 DIAGNOSIS — M2041 Other hammer toe(s) (acquired), right foot: Secondary | ICD-10-CM

## 2019-10-04 DIAGNOSIS — R2681 Unsteadiness on feet: Secondary | ICD-10-CM | POA: Diagnosis not present

## 2019-10-04 DIAGNOSIS — I6930 Unspecified sequelae of cerebral infarction: Secondary | ICD-10-CM

## 2019-10-04 DIAGNOSIS — M2042 Other hammer toe(s) (acquired), left foot: Secondary | ICD-10-CM

## 2019-10-04 NOTE — Progress Notes (Signed)
Subjective:   Patient ID: Dorothy Marquez, female   DOB: 34 y.o.   MRN: [id number]   HPI 34 year old female presents the office today for concerns of toe contractures on her left foot which has been getting worse over the last year.  She also has noticed that her left ankle is been turning inwards and has been ongoing since she had a stroke which affected her left foot.  This has been ongoing the last 4 years or so.  She has had no recent treatment for the toes and no recent injury.  She has no other concerns today.   Review of Systems  All other systems reviewed and are negative.  Past Medical History:  Diagnosis Date  . Anxiety   . Depression   . History of hiatal hernia   . History of kidney stones    x5 episodes  . Stroke [facility])     Past Surgical History:  Procedure Laterality Date  . LAPAROSCOPIC GASTRIC SLEEVE RESECTION WITH HIATAL HERNIA REPAIR N/A 08/25/2014   Procedure: LAPAROSCOPIC GASTRIC SLEEVE RESECTION WITH HIATAL HERNIA REPAIR;  Surgeon: Gaynelle Adu, MD;  Location: [facility];  Service: General;  Laterality: N/A;  . UPPER GI ENDOSCOPY N/A 08/25/2014   Procedure: UPPER GI ENDOSCOPY;  Surgeon: Gaynelle Adu, MD;  Location: [facility];  Service: General;  Laterality: N/A;  . WISDOM TOOTH EXTRACTION       Current Outpatient Medications:  .  busPIRone (BUSPAR) 7.5 MG tablet, Take 1-2 tablets (7.5-15 mg total) by mouth 2 (two) times daily., Disp: 90 tablet, Rfl: 3 .  clotrimazole-betamethasone (LOTRISONE) cream, Apply 1 application topically 2 (two) times daily., Disp: 45 g, Rfl: 0 .  FLUoxetine (PROZAC) 20 MG capsule, Take 1 capsule (20 mg total) by mouth daily. Take with 40 mg Prozac for TDD 60 mg., Disp: 90 capsule, Rfl: 3 .  FLUoxetine (PROZAC) 40 MG capsule, Take 1 capsule (40 mg total) by mouth daily., Disp: 90 capsule, Rfl: 3  Allergies  Allergen Reactions  . Iodinated Diagnostic Agents Hives         Objective:  Physical Exam  General: AAO x3, NAD  Dermatological: Skin  is warm, dry and supple bilateral.There are no open sores, no preulcerative lesions, no rash or signs of infection present.  Vascular: DP 2/4 bilaterally, PT 1/4 bilaterally although chronic swelling to the ankles limits this.  There is no pain with calf compression, swelling, warmth, erythema.   Neruologic: Sensation absent on left side but intact on the right side with Semmes Weinstein monofilament  Musculoskeletal: Semirigid hammertoe contractures present left side there is bone infection at the level of the MPJ.  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  Decreased function left side secondary to stroke  Gait: Unassisted, Nonantalgic.       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  For now I recommend to try conservative treatment first.  Given ankle deformity as well the toe contractures I think that she would benefit from a brace as well to try to have her follow-up in our [place] office to see Raiford Noble our pedorthotist for casting of a brace.  Also dispensed toe crest for her.  In the future consider digital fusions.  Vivi Barrack DPM

## 2019-10-04 NOTE — Telephone Encounter (Signed)
Left message for pt to call to schedule an appt with [facility] for a possible brace.

## 2019-10-07 NOTE — Telephone Encounter (Signed)
Pt scheduled to see Raiford Noble 8.10.2021.Marland KitchenMarland Kitchen

## 2019-10-15 ENCOUNTER — Other Ambulatory Visit: Payer: Self-pay

## 2019-10-15 ENCOUNTER — Ambulatory Visit: Payer: Medicare HMO | Admitting: Orthotics

## 2019-10-15 DIAGNOSIS — M21372 Foot drop, left foot: Secondary | ICD-10-CM

## 2019-10-15 DIAGNOSIS — M2042 Other hammer toe(s) (acquired), left foot: Secondary | ICD-10-CM

## 2019-10-15 DIAGNOSIS — I6930 Unspecified sequelae of cerebral infarction: Secondary | ICD-10-CM

## 2019-10-15 DIAGNOSIS — R2681 Unsteadiness on feet: Secondary | ICD-10-CM

## 2019-10-15 NOTE — Progress Notes (Signed)
  Patient presents today for evaluation/casting for AFO brace (L).   Patient has hx of the following conditions: Gait instability,  Ankle instabilty,  Gait analysis done and patient displays abnormality of gait in both sagittial and frontal planes, and could benefit in aggressive ankle support.  Patient chose [place] brace w/ lace/speed laces.   Also dorsi assist springs to help with foot drop and severe ff adduction/inversion

## 2019-12-31 ENCOUNTER — Other Ambulatory Visit: Payer: Self-pay

## 2019-12-31 ENCOUNTER — Ambulatory Visit (INDEPENDENT_AMBULATORY_CARE_PROVIDER_SITE_OTHER): Payer: Medicare HMO | Admitting: Orthotics

## 2019-12-31 DIAGNOSIS — M21372 Foot drop, left foot: Secondary | ICD-10-CM

## 2019-12-31 DIAGNOSIS — R2681 Unsteadiness on feet: Secondary | ICD-10-CM

## 2019-12-31 DIAGNOSIS — M2042 Other hammer toe(s) (acquired), left foot: Secondary | ICD-10-CM

## 2019-12-31 DIAGNOSIS — I6930 Unspecified sequelae of cerebral infarction: Secondary | ICD-10-CM

## 2019-12-31 DIAGNOSIS — M2041 Other hammer toe(s) (acquired), right foot: Secondary | ICD-10-CM

## 2019-12-31 NOTE — Progress Notes (Signed)
Patient  came in to pick up [place] brace w/ dorsi assist springs.  The brace fit well and immediately patient's  gait approved.  The brace provided desired m-l stability in frontal/transverse planes and aided in dorsiflexion in saggital plane. Patient was able to don and doff brace with minimal difficulty.  Overall patient pleased with fit and functionality of brace.  

## 2020-03-18 ENCOUNTER — Encounter: Payer: Medicare HMO | Admitting: Osteopathic Medicine

## 2020-08-13 ENCOUNTER — Telehealth (INDEPENDENT_AMBULATORY_CARE_PROVIDER_SITE_OTHER): Payer: Medicare HMO | Admitting: Osteopathic Medicine

## 2020-08-13 ENCOUNTER — Encounter: Payer: Self-pay | Admitting: Osteopathic Medicine

## 2020-08-13 VITALS — Wt 256.0 lb

## 2020-08-13 DIAGNOSIS — F39 Unspecified mood [affective] disorder: Secondary | ICD-10-CM

## 2020-08-13 DIAGNOSIS — F4310 Post-traumatic stress disorder, unspecified: Secondary | ICD-10-CM | POA: Diagnosis not present

## 2020-08-13 DIAGNOSIS — F330 Major depressive disorder, recurrent, mild: Secondary | ICD-10-CM | POA: Diagnosis not present

## 2020-08-13 DIAGNOSIS — F411 Generalized anxiety disorder: Secondary | ICD-10-CM

## 2020-08-13 DIAGNOSIS — Z13220 Encounter for screening for lipoid disorders: Secondary | ICD-10-CM

## 2020-08-13 MED ORDER — FLUOXETINE HCL 40 MG PO CAPS: 40.0000 mg | ORAL_CAPSULE | Freq: Every day | ORAL | 3 refills | Status: DC

## 2020-08-13 MED ORDER — FLUOXETINE HCL 20 MG PO CAPS: 20.0000 mg | ORAL_CAPSULE | Freq: Every day | ORAL | 3 refills | Status: DC

## 2020-08-13 MED ORDER — BUSPIRONE HCL 7.5 MG PO TABS: 7.5000 mg | ORAL_TABLET | Freq: Two times a day (BID) | ORAL | 3 refills | Status: DC

## 2020-08-13 NOTE — Progress Notes (Signed)
Telemedicine Visit via  Video & Audio (App used: MyChart)  I connected with Dorothy Marquez on 08/13/20 at 9:30 AM  by phone or  telemedicine application as noted above  I verified that I am speaking with or regarding  the correct patient using two identifiers.  Participants: Myself, Dr Sunnie Nielsen DO Patient: Dorothy Marquez Patient proxy if applicable: none Other, if applicable: none  Patient is at home I am in office at [facility]    I discussed the limitations of evaluation and management  by telemedicine and the availability of in person appointments.  The participant(s) above expressed understanding and  agreed to proceed with this appointment via telemedicine.       History of Present Illness: Dorothy Marquez is a 35 y.o. female who would like to discuss mental health recheck - has been about a year since last visit, pt reports doing well on current medications and no complaints.     Depression screen [facility] 2/9 08/13/2020 05/09/2019 04/11/2019  Decreased Interest 0 3 2  Down, Depressed, Hopeless 0 0 2  PHQ - 2 Score 0 3 4  Altered sleeping - 2 3  Tired, decreased energy - 2 3  Change in appetite - 0 0  Feeling bad or failure about yourself  - 0 0  Trouble concentrating - 2 2  Moving slowly or fidgety/restless - 2 0  Suicidal thoughts - 0 0  PHQ-9 Score - 11 12  Difficult doing work/chores - Somewhat difficult Somewhat difficult       Observations/Objective: Wt 256 lb (116.1 kg)   BMI 40.10 kg/m  BP Readings from Last 3 Encounters:  10/04/19 (!) 124/88  09/04/19 116/79  10/28/18 111/68   Exam: Normal Speech.  NAD  Lab and Radiology Results No results found for this or any previous visit (from the past 72 hour(s)). No results found.     Assessment and Plan: 35 y.o. female with The primary encounter diagnosis was GAD (generalized anxiety disorder). Diagnoses of Mild episode of recurrent major depressive disorder (HCC), PTSD (post-traumatic  stress disorder), and Mood disorder (HCC) were also pertinent to this visit.- all stable on current medications.   Refilled Rx Plan for in-office annual in 1 year  Labs soon - pt to come to [facility] downstairs anytime or call us to get on clinic phlebotomist's schedule    PDMP not reviewed this encounter. No orders of the defined types were placed in this encounter.  Meds ordered this encounter  Medications   busPIRone (BUSPAR) 7.5 MG tablet    Sig: Take 1-2 tablets (7.5-15 mg total) by mouth 2 (two) times daily.    Dispense:  90 tablet    Refill:  3   FLUoxetine (PROZAC) 20 MG capsule    Sig: Take 1 capsule (20 mg total) by mouth daily. 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    Dispense:  90 capsule    Refill:  3   FLUoxetine (PROZAC) 40 MG capsule    Sig: Take 1 capsule (40 mg total) by mouth daily.    Dispense:  90 capsule    Refill:  3   There are no Patient Instructions on file for this visit.  Instructions sent via MyChart.   Follow Up Instructions: Return for labs soemtime in next month, annual phsyical in one year in office .    I discussed the assessment and treatment plan with the patient. The patient was provided an opportunity to ask questions and all  were answered. The patient agreed with the plan and demonstrated an understanding of the instructions.   The patient was advised to call back or seek an in-person evaluation if any new concerns, if symptoms worsen or if the condition fails to improve as anticipated.  20 minutes of non-face-to-face time was provided during this encounter.      . . . . . . . . . . . . . Marland Kitchen                   Historical information moved to improve visibility of documentation.  Past Medical History:  Diagnosis Date   Anxiety    Depression    History of hiatal hernia    History of kidney stones    x5 episodes   Stroke [facility])    Past Surgical History:  Procedure Laterality Date   LAPAROSCOPIC GASTRIC  SLEEVE RESECTION WITH HIATAL HERNIA REPAIR N/A 08/25/2014   Procedure: LAPAROSCOPIC GASTRIC SLEEVE RESECTION WITH HIATAL HERNIA REPAIR;  Surgeon: Gaynelle Adu, MD;  Location: [facility];  Service: General;  Laterality: N/A;   UPPER GI ENDOSCOPY N/A 08/25/2014   Procedure: UPPER GI ENDOSCOPY;  Surgeon: Gaynelle Adu, MD;  Location: [facility];  Service: General;  Laterality: N/A;   WISDOM TOOTH EXTRACTION     Social History   Tobacco Use   Smoking status: Never   Smokeless tobacco: Never  Substance Use Topics   Alcohol use: Yes    Comment: rare- social   family history includes Alcohol abuse in her maternal uncle; Cancer in her maternal aunt; Depression in her brother, maternal aunt, and mother; Diabetes in her maternal aunt and mother; Hyperlipidemia in her brother.  Medications: Current Outpatient Medications  Medication Sig Dispense Refill   busPIRone (BUSPAR) 7.5 MG tablet Take 1-2 tablets (7.5-15 mg total) by mouth 2 (two) times daily. 90 tablet 3   FLUoxetine (PROZAC) 20 MG capsule Take 1 capsule (20 mg total) by mouth daily. 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 90 capsule 3   FLUoxetine (PROZAC) 40 MG capsule Take 1 capsule (40 mg total) by mouth daily. 90 capsule 3   No current facility-administered medications for this visit.   Allergies  Allergen Reactions   Iodinated Diagnostic Agents Hives     If phone visit, billing and coding can please add appropriate modifier if needed

## 2020-12-30 ENCOUNTER — Ambulatory Visit: Payer: Medicare HMO | Admitting: Medical-Surgical

## 2021-01-27 ENCOUNTER — Ambulatory Visit: Payer: Medicare HMO | Admitting: Medical-Surgical

## 2021-02-24 IMAGING — US US MFM OB DETAIL +14 WK
1 series · 14 of 28 positions shown · non-contrast
Comparison: none

[Series 1: us mfm ob detail +14 wk · 75 acquisitions, 14 frames shown]
[im 3/75]
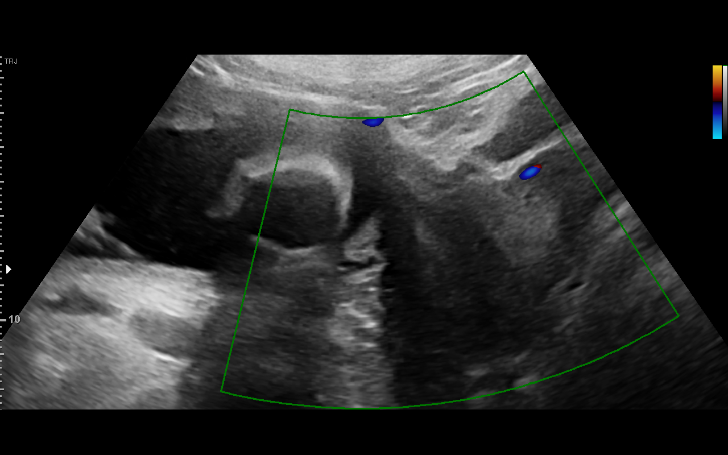
[im 9/75]
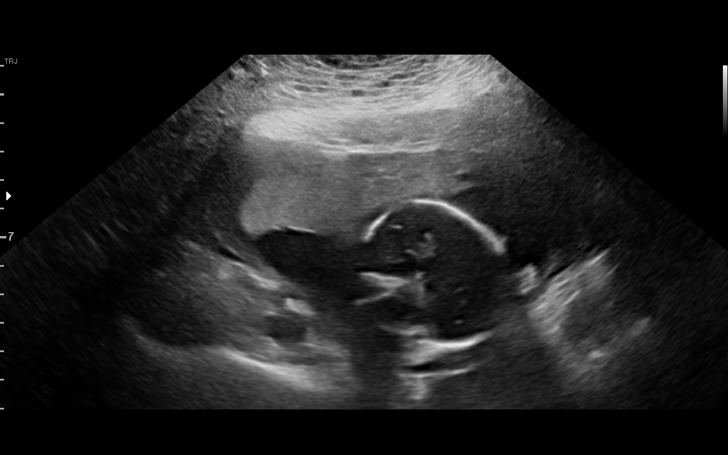
[im 14/75]
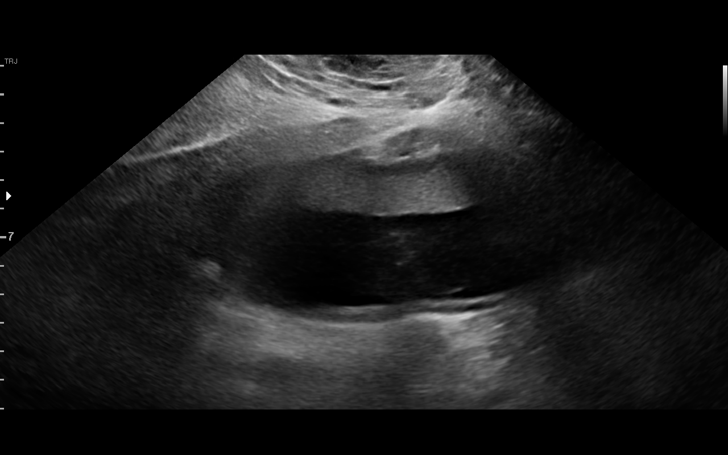
[im 20/75]
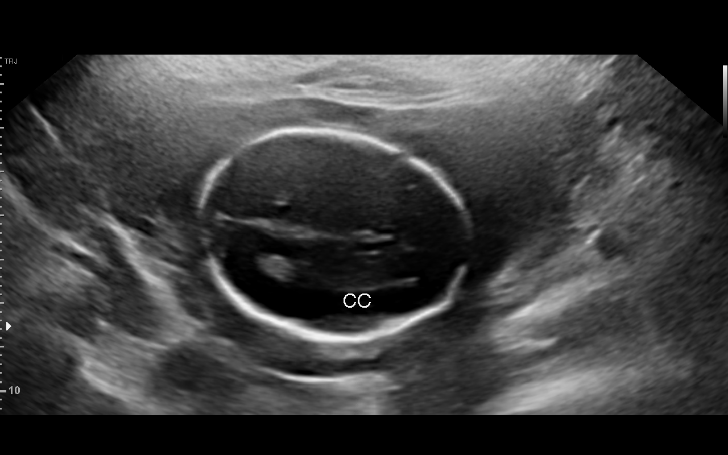
[im 25/75]
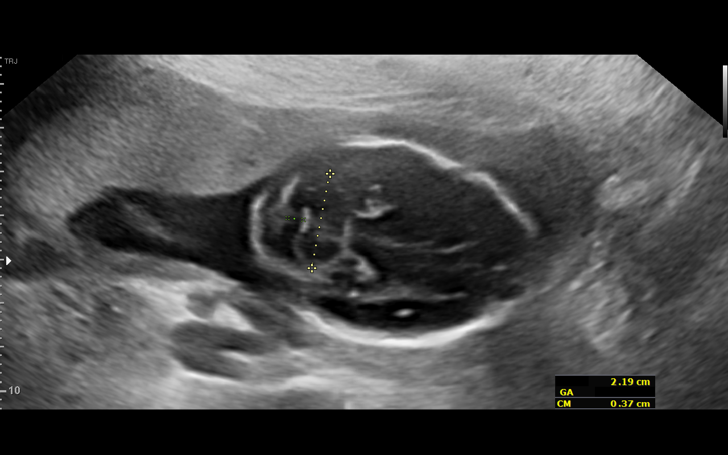
[im 31/75]
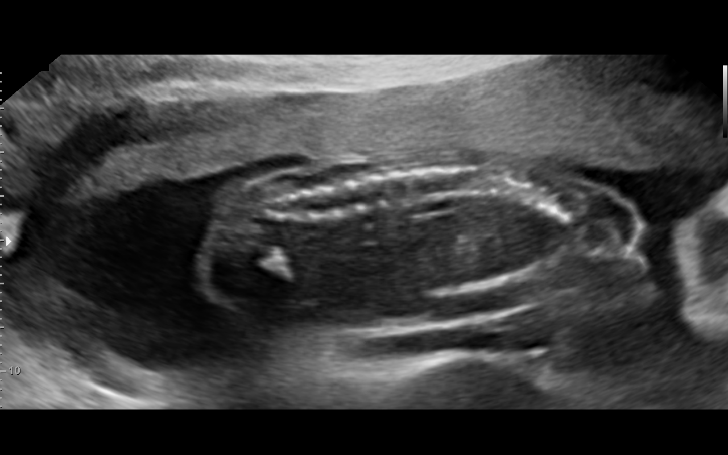
[im 36/75]
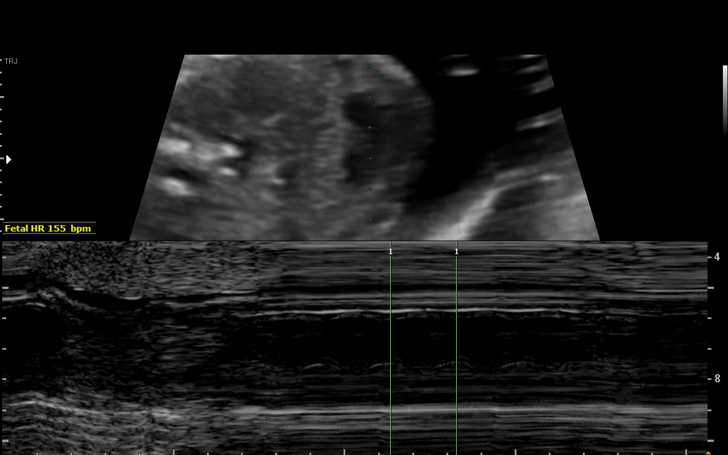
[im 42/75]
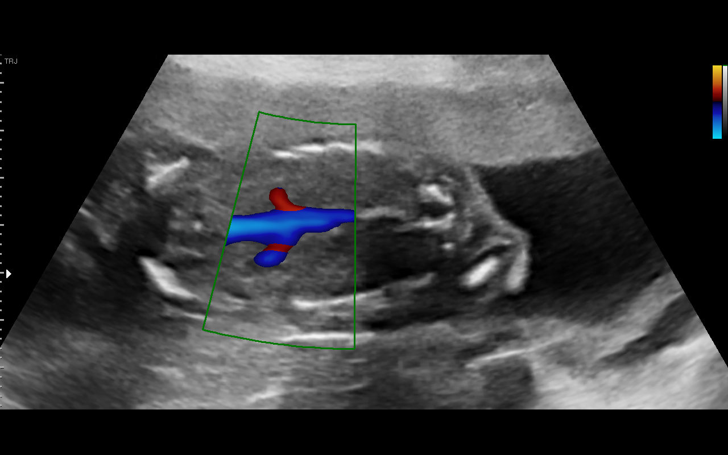
[im 47/75]
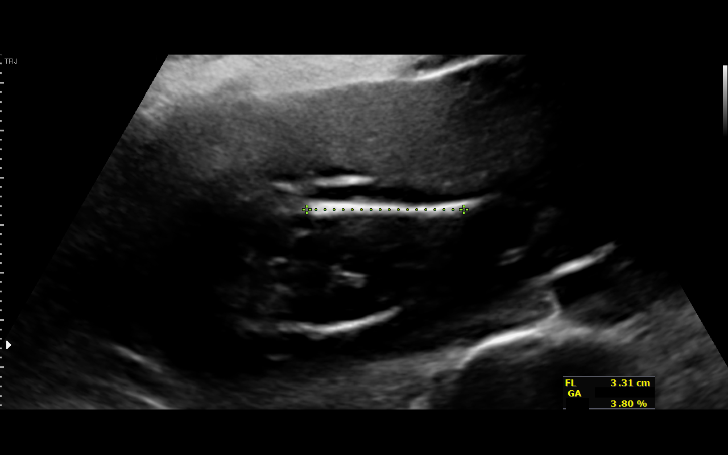
[im 53/75]
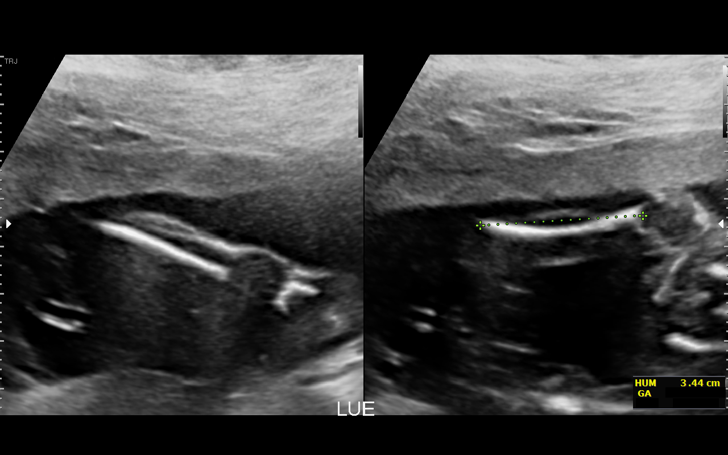
[im 58/75]
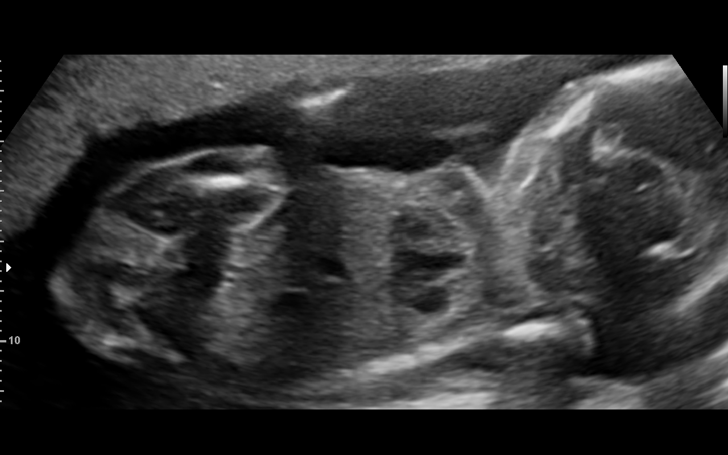
[im 64/75]
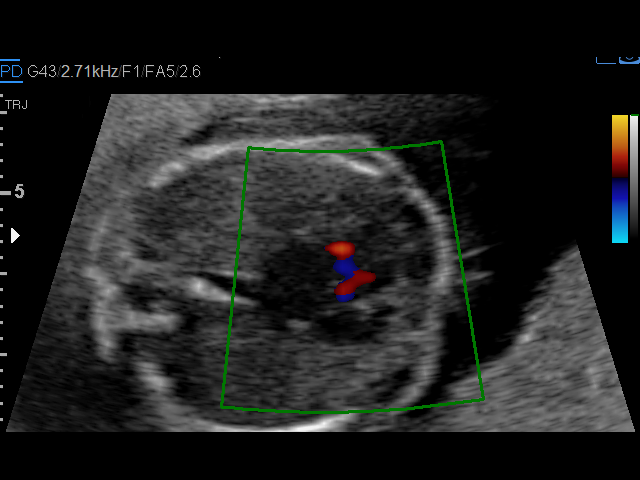
[im 69/75]
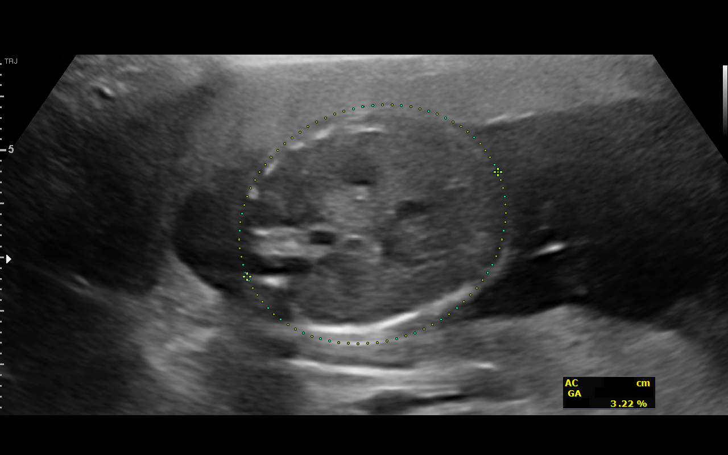
[im 75/75]
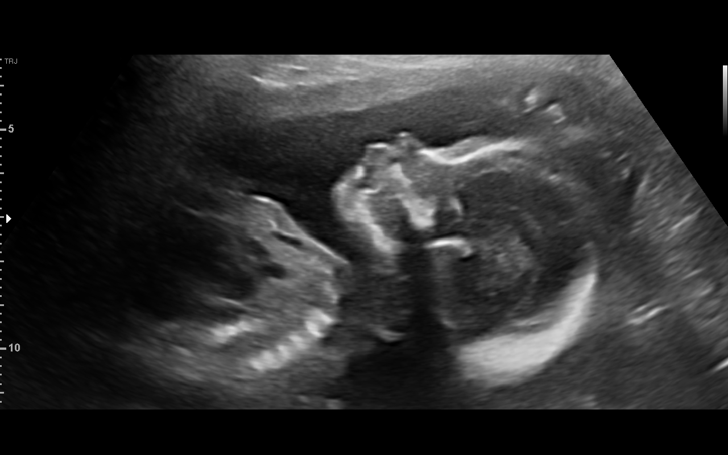

[14 of 28 positions shown; findings below may reference images not displayed]

----------------------------------------------------------------------

 ----------------------------------------------------------------------
Indications

  20 weeks gestation of pregnancy
  Late prenatal care, second trimester
  Encounter for antenatal screening for
  malformations
  Pregnancy complicated by previous gastric
  bypass, antepartum, second trimester
  Obesity complicating pregnancy, second
  trimester
  Medical complication of pregnancy (CVA
  secondary to Adderall)
 ----------------------------------------------------------------------
Fetal Evaluation

 Num Of Fetuses:         1
 Fetal Heart Rate(bpm):  155
 Cardiac Activity:       Observed
 Presentation:           Cephalic
 Placenta:               Anterior
 P. Cord Insertion:      Visualized

 Amniotic Fluid
 AFI FV:      Within normal limits

                             Largest Pocket(cm)

Biometry

 BPD:      46.5  mm     G. Age:  20w 0d         46  %    CI:        68.94   %    70 - 86
                                                         FL/HC:      19.2   %    16.8 -
 HC:      178.9  mm     G. Age:  20w 2d         51  %    HC/AC:      1.25        1.09 -
 AC:      143.2  mm     G. Age:  19w 4d         29  %    FL/BPD:     74.0   %
 FL:       34.4  mm     G. Age:  20w 6d         66  %    FL/AC:      24.0   %    20 - 24
 HUM:      32.6  mm     G. Age:  21w 0d         74  %
 CER:      21.9  mm     G. Age:  20w 6d         61  %

 LV:        6.6  mm
 CM:        3.7  mm

 Est. FW:     339  gm    0 lb 12 oz      50  %
OB History

 Gravidity:    3         Term:   2
Gestational Age

 LMP:           22w 0d        Date:  01/24/18                 EDD:   10/31/18
 U/S Today:     20w 1d                                        EDD:   11/13/18
 Best:          20w 1d     Det. By:  U/S (06/27/18)           EDD:   11/13/18
Anatomy

 Cranium:               Appears normal         Aortic Arch:            Not well visualized
 Cavum:                 Appears normal         Ductal Arch:            Not well visualized
 Ventricles:            Appears normal         Diaphragm:              Not well visualized
 Choroid Plexus:        Appears normal         Stomach:                Appears normal, left
                                                                       sided
 Cerebellum:            Appears normal         Abdomen:                Appears normal
 Posterior Fossa:       Appears normal         Abdominal Wall:         Appears nml (cord
                                                                       insert, abd wall)
 Nuchal Fold:           Not applicable (>20    Cord Vessels:           Appears normal (3
                        wks GA)                                        vessel cord)
 Face:                  Appears normal         Kidneys:                Appear normal
                        (orbits and profile)
 Lips:                  Appears normal         Bladder:                Appears normal
 Palate:                Not well visualized    Spine:                  Appears normal
 Heart:                 Appears normal         Upper Extremities:      Appears normal
                        (4CH, axis, and
                        situs)
 RVOT:                  Not well visualized    Lower Extremities:      Appears normal
 LVOT:                  Not well visualized

 Other:  Nasal bone visualized. Heels visualized. Fetus appears to be female.
         Hands and feet visualized.
Cervix Uterus Adnexa

 Cervix
 Length:           5.61  cm.
 Normal appearance by transabdominal scan.

 Left Ovary
 Not visualized. No adnexal mass visualized.

 Right Ovary
 Not visualized. No adnexal mass visualized.
Impression

 We performed fetal anatomy scan. No makers of
 aneuploidies or fetal structural defects are seen. Fetal
 biometry is consistent with 20w 1d.  Amniotic fluid is normal
 and good fetal activity is seen.
 We have assigned the EDD at 11/13/2018. Patient has not
 had screening for fetal aneuploidies.
 Patient gives history of stroke (following adderall?) with
 complete recovery. She is not on anticoagulation.
Recommendations

 An appointment was made for her to return in 6 weeks for
 completion of fetal anatomy.
                 Beu, Daniel Rodrigues

## 2021-03-02 ENCOUNTER — Ambulatory Visit (INDEPENDENT_AMBULATORY_CARE_PROVIDER_SITE_OTHER): Payer: Medicare HMO | Admitting: Medical-Surgical

## 2021-03-02 ENCOUNTER — Encounter: Payer: Self-pay | Admitting: Medical-Surgical

## 2021-03-02 ENCOUNTER — Other Ambulatory Visit: Payer: Self-pay

## 2021-03-02 VITALS — BP 128/79 | HR 101 | Ht 67.0 in | Wt 319.0 lb

## 2021-03-02 DIAGNOSIS — I10 Essential (primary) hypertension: Secondary | ICD-10-CM

## 2021-03-02 DIAGNOSIS — F32A Depression, unspecified: Secondary | ICD-10-CM

## 2021-03-02 DIAGNOSIS — Z7689 Persons encountering health services in other specified circumstances: Secondary | ICD-10-CM

## 2021-03-02 DIAGNOSIS — F411 Generalized anxiety disorder: Secondary | ICD-10-CM | POA: Diagnosis not present

## 2021-03-02 DIAGNOSIS — F4310 Post-traumatic stress disorder, unspecified: Secondary | ICD-10-CM

## 2021-03-02 MED ORDER — FLUOXETINE HCL 40 MG PO CAPS: 40.0000 mg | ORAL_CAPSULE | Freq: Every day | ORAL | 3 refills | Status: DC

## 2021-03-02 MED ORDER — FLUOXETINE HCL 20 MG PO CAPS: 20.0000 mg | ORAL_CAPSULE | Freq: Every day | ORAL | 3 refills | Status: DC

## 2021-03-02 MED ORDER — BUSPIRONE HCL 7.5 MG PO TABS: 7.5000 mg | ORAL_TABLET | Freq: Two times a day (BID) | ORAL | 3 refills | Status: DC

## 2021-03-02 MED ORDER — BUPROPION HCL ER (XL) 150 MG PO TB24: 150.0000 mg | ORAL_TABLET | ORAL | 0 refills | Status: DC

## 2021-03-02 NOTE — Progress Notes (Signed)
HPI with pertinent ROS:   CC: Transfer of care, mood follow-up  HPI: Pleasant 35 year old female presenting today to transfer care to a new PCP and to follow-up on mood.  She has been taking fluoxetine 60 mg daily which she reports helps her depressive symptoms but still complains of significant chronic fatigue, oversleeping, and depression.  She is not currently doing counseling and is not very open to the idea since she has had bad experiences in her younger years.  She is using BuSpar 7.5 mg twice daily to help with anxiety and reports the medication is doing a fairly decent job with that.  Is not currently exercising.  Notes that her diet has been very bad lately and that she has gained quite a bit of weight.  Also notes that she does not regularly drink more than 2 red solo cups of water per day.  Denies SI/HI.  I reviewed the past medical history, family history, social history, surgical history, and allergies today and no changes were needed.  Please see the problem list section below in epic for further details.  Depression screen [facility] 2/9 03/02/2021 08/13/2020 05/09/2019 04/11/2019 03/27/2019  Decreased Interest 3 0 3 2 0  Down, Depressed, Hopeless 1 0 0 2 2  PHQ - 2 Score 4 0 3 4 2   Altered sleeping 1 - 2 3 2   Tired, decreased energy 3 - 2 3 3   Change in appetite 3 - 0 0 0  Feeling bad or failure about yourself  1 - 0 0 0  Trouble concentrating 3 - 2 2 2   Moving slowly or fidgety/restless 3 - 2 0 0  Suicidal thoughts 1 - 0 0 0  PHQ-9 Score 19 - 11 12 9   Difficult doing work/chores Somewhat difficult - Somewhat difficult Somewhat difficult Somewhat difficult   GAD 7 : Generalized Anxiety Score 03/02/2021 05/09/2019 04/11/2019 03/27/2019  Nervous, Anxious, on Edge 1 1 2 3   Control/stop worrying 1 1 2 3   Worry too much - different things 1 1 2 3   Trouble relaxing 1 2 2 3   Restless 1 2 0 0  Easily annoyed or irritable 1 1 2 2   Afraid - awful might happen 1 2 2 3   Total GAD 7 Score 7 10 12  17   Anxiety Difficulty Somewhat difficult Somewhat difficult Somewhat difficult Somewhat difficult     Physical exam:   General: Well Developed, well nourished, and in no acute distress.  Neuro: Alert and oriented x3.  HEENT: Normocephalic, atraumatic.  Skin: Warm and dry. Cardiac: Regular rate and rhythm, no murmurs rubs or gallops, no lower extremity edema.  Respiratory: Clear to auscultation bilaterally. Not using accessory muscles, speaking in full sentences.  Impression and Recommendations:    1. Encounter to establish care Reviewed available information and discussed care concerns with patient.   2. Essential (primary) hypertension Blood pressure at goal today.  Recommend low-sodium diet, regular exercise, and weight loss.  3. GAD (generalized anxiety disorder) 4. Depression, unspecified depression type 5. PTSD (post-traumatic stress disorder) Continue fluoxetine 60 mg daily.  Continue BuSpar 7.5 mg twice daily.  Adding Wellbutrin 150 mg daily.  Strongly recommend counseling but she would like to think about this.  Also strongly recommend dietary modification and starting regular exercise.  Discussed the benefits of starting small and working her way up.  Talked in depth about forming habits and making lifestyle changes rather than temporary ones.  Return in about 4 weeks (around 03/30/2021) for mood follow up.  ___________________________________________ Thayer Ohm, DNP, APRN, FNP-BC Primary Care and Sports Medicine [facility] [place]

## 2021-03-09 ENCOUNTER — Encounter: Payer: Self-pay | Admitting: Medical-Surgical

## 2021-03-16 ENCOUNTER — Ambulatory Visit (INDEPENDENT_AMBULATORY_CARE_PROVIDER_SITE_OTHER): Payer: Medicare HMO | Admitting: Family Medicine

## 2021-03-16 ENCOUNTER — Other Ambulatory Visit: Payer: Self-pay

## 2021-03-16 ENCOUNTER — Encounter: Payer: Self-pay | Admitting: Family Medicine

## 2021-03-16 VITALS — BP 119/82 | HR 106 | Temp 97.8°F | Ht 64.0 in | Wt 319.0 lb

## 2021-03-16 DIAGNOSIS — R300 Dysuria: Secondary | ICD-10-CM | POA: Diagnosis not present

## 2021-03-16 DIAGNOSIS — Z113 Encounter for screening for infections with a predominantly sexual mode of transmission: Secondary | ICD-10-CM | POA: Diagnosis not present

## 2021-03-16 DIAGNOSIS — N3000 Acute cystitis without hematuria: Secondary | ICD-10-CM

## 2021-03-16 LAB — POCT URINALYSIS DIP (CLINITEK)
Blood, UA: NEGATIVE
Glucose, UA: NEGATIVE mg/dL
Nitrite, UA: POSITIVE — AB
POC PROTEIN,UA: NEGATIVE
Spec Grav, UA: >=1.03 — AB (ref 1.010–1.025)
Urobilinogen, UA: 1 E.U./dL
pH, UA: 5.5 (ref 5.0–8.0)

## 2021-03-16 MED ORDER — SULFAMETHOXAZOLE-TRIMETHOPRIM 800-160 MG PO TABS: 1.0000 | ORAL_TABLET | Freq: Two times a day (BID) | ORAL | 0 refills | Status: AC

## 2021-03-16 NOTE — Progress Notes (Signed)
Acute Office Visit  Subjective:    Patient ID: Dorothy Marquez, female    DOB: 10-Jun-1985, 36 y.o.   MRN: [id number]  Chief Complaint  Patient presents with   Urinary Tract Infection    Urinary Tract Infection  This is a new problem. The current episode started in the past 7 days (3-4 days ago). The problem has been waxing and waning. The quality of the pain is described as aching (suprapuribc pressure). The pain is at a severity of 5/10. There has been no fever. She is Sexually active. Associated symptoms include frequency, hematuria, hesitancy and urgency. Pertinent negatives include no chills, discharge, flank pain, nausea, possible pregnancy, sweats or vomiting. She has tried increased fluids for the symptoms. The treatment provided mild relief. frequent UTIs since childhood, kidney stones  Reports urine is dark and malodorous. She denies any vaginal symptoms but would like to have STD swab.    Past Medical History:  Diagnosis Date   Anxiety    Depression    History of hiatal hernia    History of kidney stones    x5 episodes   Stroke [facility])     Past Surgical History:  Procedure Laterality Date   LAPAROSCOPIC GASTRIC SLEEVE RESECTION WITH HIATAL HERNIA REPAIR N/A 08/25/2014   Procedure: LAPAROSCOPIC GASTRIC SLEEVE RESECTION WITH HIATAL HERNIA REPAIR;  Surgeon: Greer Pickerel, MD;  Location: [facility];  Service: General;  Laterality: N/A;   UPPER GI ENDOSCOPY N/A 08/25/2014   Procedure: UPPER GI ENDOSCOPY;  Surgeon: Greer Pickerel, MD;  Location: [facility];  Service: General;  Laterality: N/A;   WISDOM TOOTH EXTRACTION      Family History  Problem Relation Age of Onset   Depression Mother    Diabetes Mother    Depression Brother    Hyperlipidemia Brother    Diabetes Maternal Aunt    Depression Maternal Aunt    Cancer Maternal Aunt        uterine   Alcohol abuse Maternal Uncle     Social History   Socioeconomic History   Marital status: Single    Spouse name: Not on file   Number  of children: Not on file   Years of education: Not on file   Highest education level: Not on file  Occupational History   Occupation: disabled  Tobacco Use   Smoking status: Never   Smokeless tobacco: Never  Vaping Use   Vaping Use: Never used  Substance and Sexual Activity   Alcohol use: Yes    Comment: rare- social   Drug use: No   Sexual activity: Yes    Birth control/protection: None  Other Topics Concern   Not on file  Social History Narrative   Not on file   Social Determinants of Health   Financial Resource Strain: Not on file  Food Insecurity: Not on file  Transportation Needs: Not on file  Physical Activity: Not on file  Stress: Not on file  Social Connections: Not on file  Intimate Partner Violence: Not on file    Outpatient Medications Prior to Visit  Medication Sig Dispense Refill   buPROPion (WELLBUTRIN XL) 150 MG 24 hr tablet Take 1 tablet (150 mg total) by mouth every morning. 90 tablet 0   busPIRone (BUSPAR) 7.5 MG tablet Take 1-2 tablets (7.5-15 mg total) by mouth 2 (two) times daily. 90 tablet 3   FLUoxetine (PROZAC) 20 MG capsule Take 1 capsule (20 mg total) by mouth daily. Take with 40 mg Prozac for TDD 60  mg. 90 capsule 3   FLUoxetine (PROZAC) 40 MG capsule Take 1 capsule (40 mg total) by mouth daily. 90 capsule 3   No facility-administered medications prior to visit.    No Active Allergies   Review of Systems All review of systems negative except what is listed in the HPI      Objective:    Physical Exam Vitals reviewed.  Constitutional:      Appearance: Normal appearance.  HENT:     Head: Normocephalic and atraumatic.  Cardiovascular:     Rate and Rhythm: Normal rate and regular rhythm.  Pulmonary:     Effort: Pulmonary effort is normal.     Breath sounds: Normal breath sounds.  Abdominal:     General: There is no distension.     Palpations: Abdomen is soft. There is no mass.     Tenderness: There is no abdominal tenderness.  There is no guarding or rebound.     Hernia: No hernia is present.  Neurological:     General: No focal deficit present.     Mental Status: She is alert and oriented to person, place, and time. Mental status is at baseline.  Psychiatric:        Mood and Affect: Mood normal.        Behavior: Behavior normal.        Thought Content: Thought content normal.        Judgment: Judgment normal.      BP 119/82 (BP Location: Right Arm, Patient Position: Sitting, Cuff Size: Large)    Pulse (!) 106    Temp 97.8 F (36.6 C) (Oral)    Ht 5\' 4"  (1.626 m)    Wt (!) 319 lb (144.7 kg)    SpO2 98%    BMI 54.76 kg/m  Wt Readings from Last 3 Encounters:  03/16/21 (!) 319 lb (144.7 kg)  03/02/21 (!) 319 lb (144.7 kg)  08/13/20 256 lb (116.1 kg)    Health Maintenance Due  Topic Date Due   Hepatitis C Screening  Never done    There are no preventive care reminders to display for this patient.   Lab Results  Component Value Date   TSH 2.79 04/05/2019   Lab Results  Component Value Date   WBC 6.3 04/05/2019   HGB 13.8 04/05/2019   HCT 42.4 04/05/2019   MCV 94.2 04/05/2019   PLT 328 04/05/2019   Lab Results  Component Value Date   NA 139 04/05/2019   K 4.3 04/05/2019   CO2 23 04/05/2019   GLUCOSE 78 04/05/2019   BUN 7 04/05/2019   CREATININE 0.63 04/05/2019   BILITOT 0.8 04/05/2019   ALKPHOS 112 09/14/2016   AST 12 04/05/2019   ALT 9 04/05/2019   PROT 6.6 04/05/2019   ALBUMIN 3.5 (L) 09/14/2016   CALCIUM 9.4 04/05/2019   ANIONGAP 8 08/26/2014   Lab Results  Component Value Date   CHOL 158 04/05/2019   Lab Results  Component Value Date   HDL 57 04/05/2019   Lab Results  Component Value Date   LDLCALC 87 04/05/2019   Lab Results  Component Value Date   TRIG 62 04/05/2019   Lab Results  Component Value Date   CHOLHDL 2.8 04/05/2019   Lab Results  Component Value Date   HGBA1C 4.2 06/07/2018       Assessment & Plan:    1. Dysuria 2. Acute cystitis without  hematuria UA with small bili, trace ketones, high specific gravity, positive  nitrites, and small leukocytes. Will go ahead and start Bactrim and send out culture - will let patient know if we have to make any changes based on culture. Would like to repeat UA in about 2-3 weeks to ensure resolution - she can do this at her upcoming appointment with PCP. Encouraged hydration. Patient aware of signs/symptoms requiring further/urgent evaluation.   - POCT URINALYSIS DIP (CLINITEK) - Urinalysis, Routine w reflex microscopic - Urine Culture - sulfamethoxazole-trimethoprim (BACTRIM DS) 800-160 MG tablet; Take 1 tablet by mouth 2 (two) times daily for 3 days.  Dispense: 6 tablet; Refill: 0  3. Screen for STD (sexually transmitted disease) - SureSwab Advanced Vaginitis Plus,TMA   Follow-up as scheduled or sooner if needed.   Terrilyn Saver, NP

## 2021-03-16 NOTE — Patient Instructions (Addendum)
Urine does look like a UTI. I am sending it for a culture to confirm, but will go ahead and start you on Bactrim (antibiotic). We will let you know if we need to make any changes based on the culture. I would like you to come back in about 2 weeks after finishing the antibiotics to leave another urine sample to ensure everything is clearing up - you can do this at your follow-up with Joy.   Sending out swab for STD testing - we will let you know results when they come in.

## 2021-03-18 LAB — SURESWAB® ADVANCED VAGINITIS PLUS,TMA
C. trachomatis RNA, TMA: NOT DETECTED
CANDIDA SPECIES: NOT DETECTED
Candida glabrata: NOT DETECTED
N. gonorrhoeae RNA, TMA: NOT DETECTED
SURESWAB(R) ADV BACTERIAL VAGINOSIS(BV),TMA: NEGATIVE
TRICHOMONAS VAGINALIS (TV),TMA: NOT DETECTED

## 2021-03-18 LAB — URINALYSIS, ROUTINE W REFLEX MICROSCOPIC
Bilirubin Urine: NEGATIVE
Glucose, UA: NEGATIVE
Hgb urine dipstick: NEGATIVE
Hyaline Cast: NONE SEEN /LPF
Ketones, ur: NEGATIVE
Nitrite: POSITIVE — AB
Specific Gravity, Urine: 1.03 (ref 1.001–1.035)
pH: 5.5 (ref 5.0–8.0)

## 2021-03-18 LAB — URINE CULTURE
MICRO NUMBER:: 12854157
SPECIMEN QUALITY:: ADEQUATE

## 2021-03-18 LAB — MICROSCOPIC MESSAGE

## 2021-03-30 ENCOUNTER — Ambulatory Visit: Payer: Medicare HMO | Admitting: Medical-Surgical

## 2021-05-12 ENCOUNTER — Other Ambulatory Visit: Payer: Self-pay

## 2021-05-12 ENCOUNTER — Encounter: Payer: Self-pay | Admitting: Medical-Surgical

## 2021-05-12 ENCOUNTER — Ambulatory Visit (INDEPENDENT_AMBULATORY_CARE_PROVIDER_SITE_OTHER): Payer: Medicare HMO | Admitting: Medical-Surgical

## 2021-05-12 VITALS — BP 115/80 | HR 89 | Resp 20 | Ht 64.0 in | Wt 315.3 lb

## 2021-05-12 DIAGNOSIS — W182XXA Fall in (into) shower or empty bathtub, initial encounter: Secondary | ICD-10-CM | POA: Diagnosis not present

## 2021-05-12 DIAGNOSIS — F330 Major depressive disorder, recurrent, mild: Secondary | ICD-10-CM | POA: Diagnosis not present

## 2021-05-12 DIAGNOSIS — F411 Generalized anxiety disorder: Secondary | ICD-10-CM

## 2021-05-12 DIAGNOSIS — Z23 Encounter for immunization: Secondary | ICD-10-CM | POA: Diagnosis not present

## 2021-05-12 IMAGING — US US MFM OB FOLLOW UP
2 series · 13 of 28 positions shown · non-contrast
Comparison: none

[Series 1: us mfm ob follow up · 9 of 20 slices shown (1 of 2)]
[im 2/20]
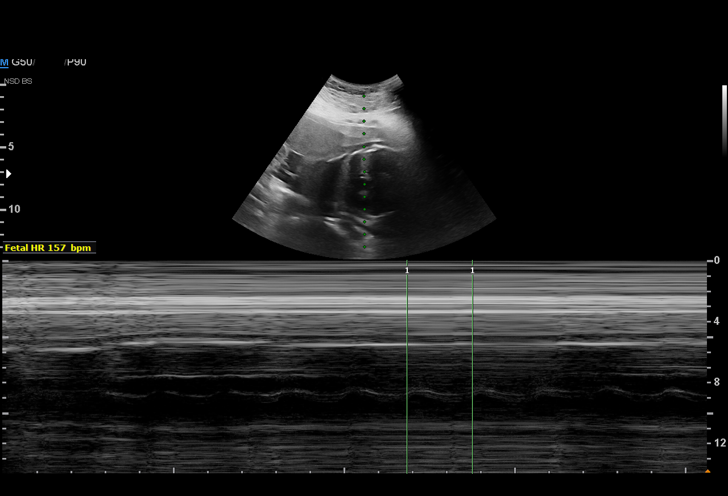
[im 4/20]
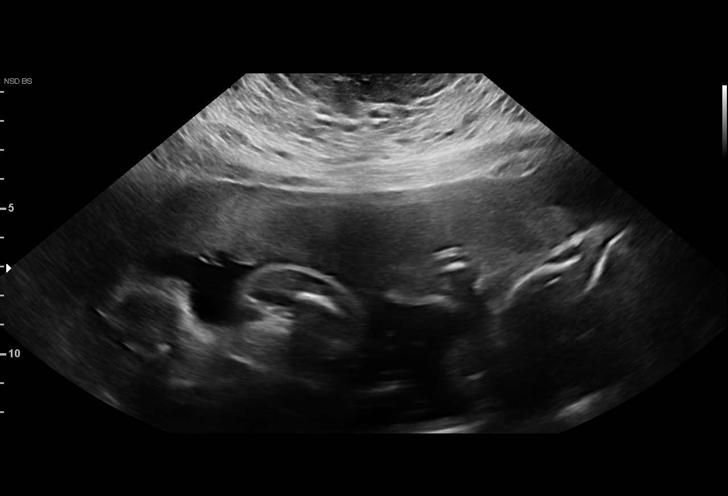
[im 6/20]
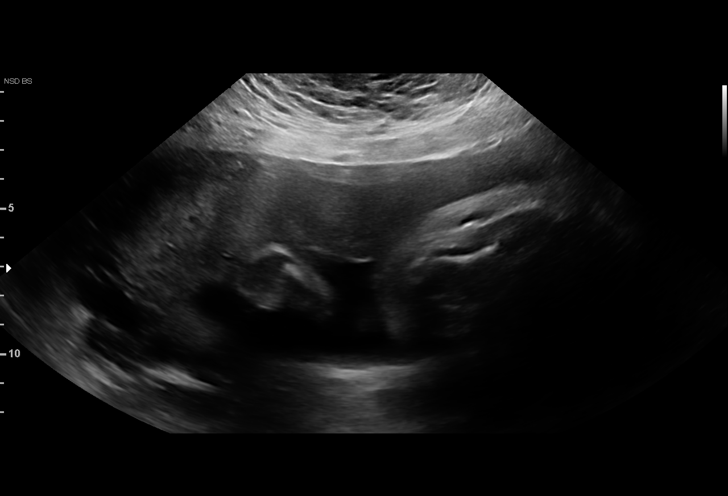
[im 8/20]
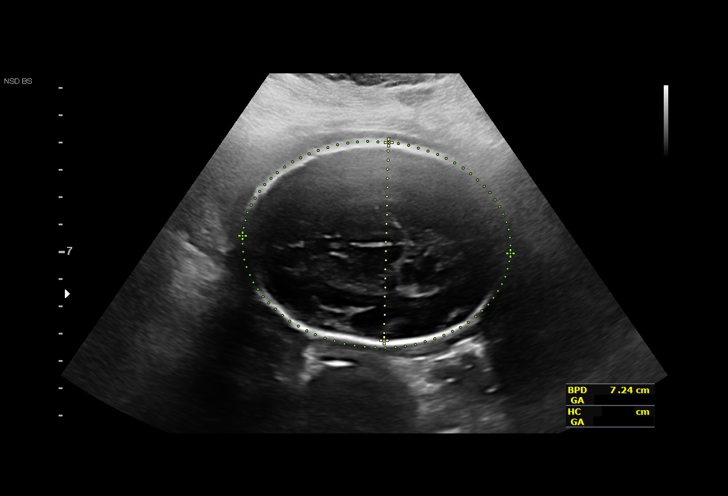
[im 10/20]
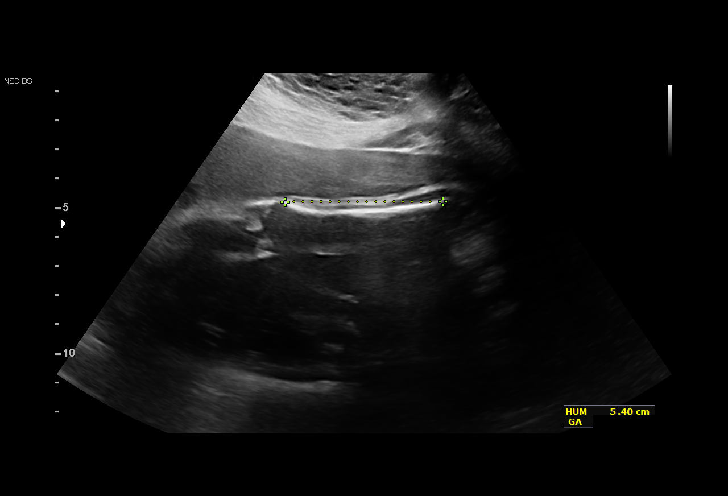
[im 12/20]
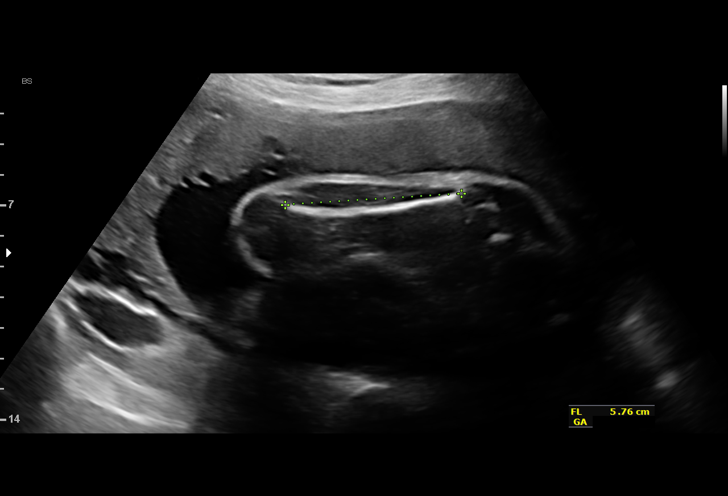
[im 15/20]
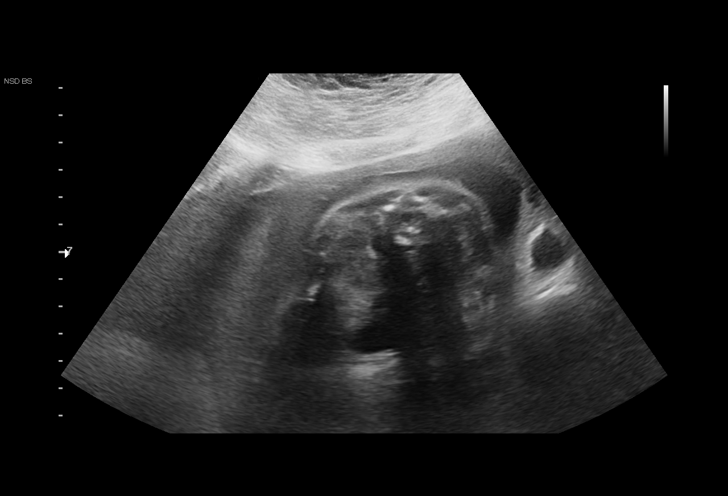
[im 17/20]
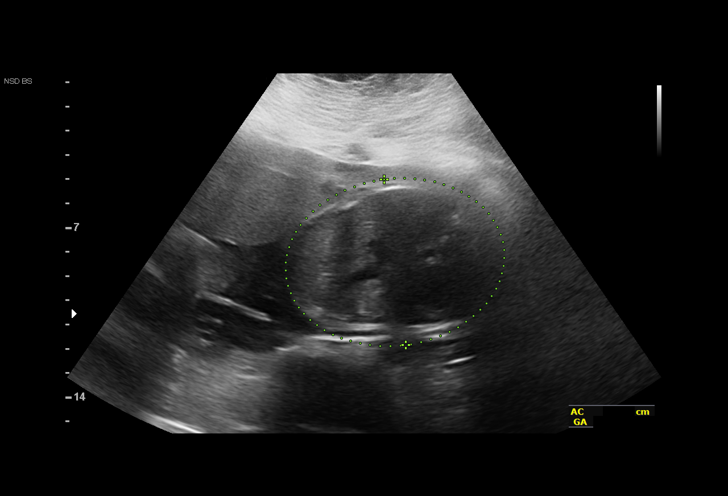
[im 20/20]
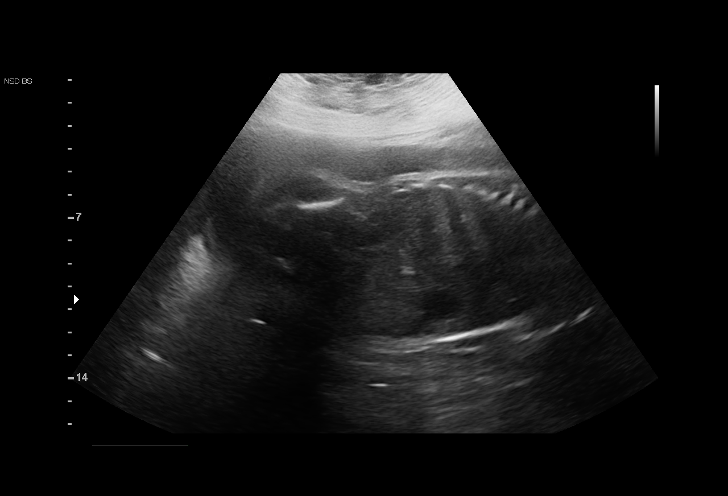

[Series 3: us mfm ob follow up · 10 acquisitions, 4 frames shown (2 of 2)]
[im 2/10]
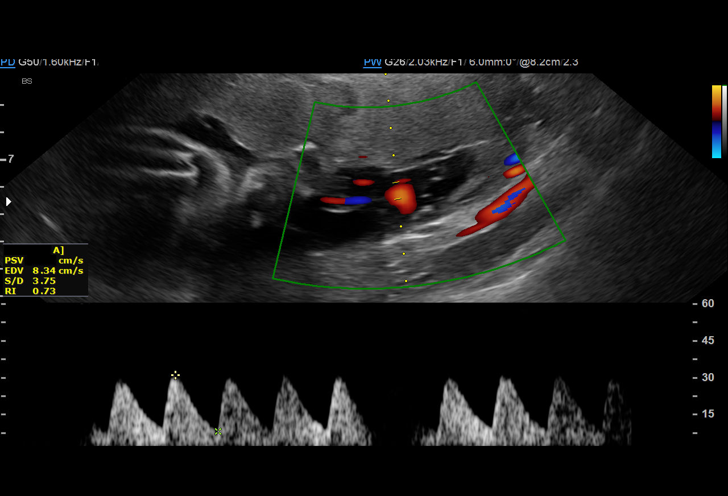
[im 4/10]
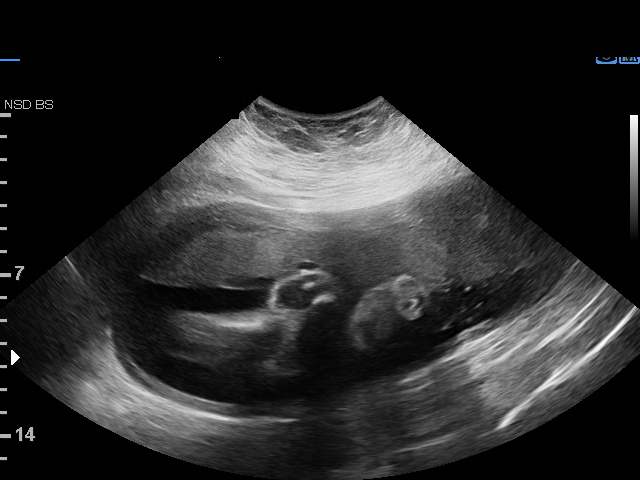
[im 6/10]
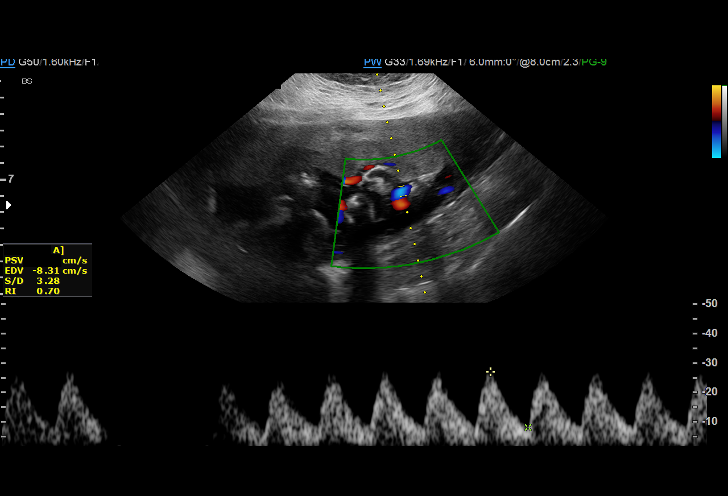
[im 8/10]
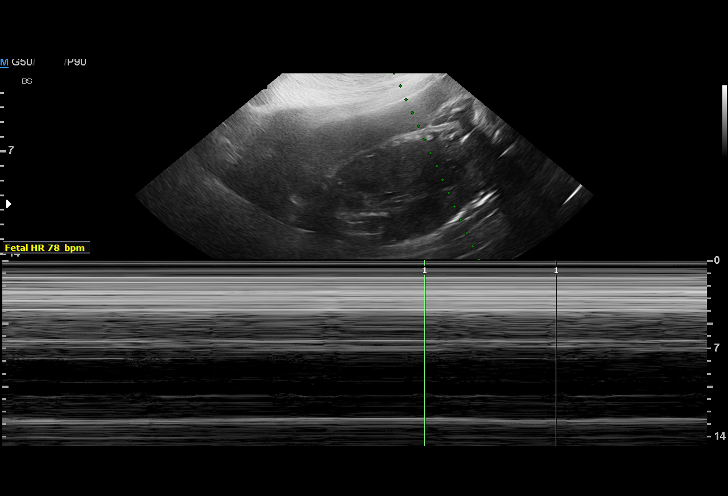

[13 of 28 positions shown; findings below may reference images not displayed]

TEPPEI
     SILVERIO/NONSTRESS                                       TEPPEI
  3  US MFM UA CORD DOPPLER               [id number]     CALLO
                                                       TEPPEI
 ----------------------------------------------------------------------

 ----------------------------------------------------------------------
Indications

  Late prenatal care, second trimester
  31 weeks gestation of pregnancy
  Encounter for antenatal screening for
  malformations
  Pregnancy complicated by previous gastric
  bypass, antepartum, third trimester
  Obesity complicating pregnancy, third
  trimester
  Medical complication of pregnancy (CVA
  secondary to Adderall)
  Antenatal follow-up for nonvisualized fetal
  anatomy
 ----------------------------------------------------------------------
Fetal Evaluation

 Num Of Fetuses:         1
 Fetal Heart Rate(bpm):  157
 Cardiac Activity:       Observed
 Presentation:           Cephalic
 Placenta:               Anterior
 P. Cord Insertion:      Previously Visualized

 Amniotic Fluid
 AFI FV:      Within normal limits

 AFI Sum(cm)     %Tile       Largest Pocket(cm)
 15.17           54
 RUQ(cm)       RLQ(cm)       LUQ(cm)        LLQ(cm)


 Comment:    Fetal decel noted (78 bpm) spontaneous recovery to 117.
Biophysical Evaluation

 Amniotic F.V:   Pocket => 2 cm two         F. Tone:        Observed
                 planes
 F. Movement:    Observed                   Score:          [DATE]
 F. Breathing:   Observed
Biometry

 BPD:        73  mm     G. Age:  29w 2d          3  %    CI:        70.88   %    70 - 86
                                                         FL/HC:      21.0   %    19.3 -
 HC:      276.3  mm     G. Age:  30w 1d          4  %    HC/AC:      1.08        0.96 -
 AC:      255.7  mm     G. Age:  29w 5d         12  %    FL/BPD:     79.5   %    71 - 87
 FL:         58  mm     G. Age:  30w 2d         17  %    FL/AC:      22.7   %    20 - 24
 HUM:        54  mm     G. Age:  31w 3d         58  %
 LV:        2.8  mm

 Est. FW:    9308  gm      3 lb 4 oz      9  %
OB History

 Gravidity:    3         Term:   2
Gestational Age

 LMP:           33w 0d        Date:  01/24/18                 EDD:   10/31/18
 U/S Today:     29w 6d                                        EDD:   11/22/18
 Best:          31w 1d     Det. By:  U/S  (06/27/18)          EDD:   11/13/18
Anatomy

 Cranium:               Appears normal         Aortic Arch:            Not well visualized
 Cavum:                 Previously seen        Ductal Arch:            Not well visualized
 Ventricles:            Appears normal         Diaphragm:              Previously seen
 Choroid Plexus:        Previously seen        Stomach:                Appears normal, left
                                                                       sided
 Cerebellum:            Previously seen        Abdomen:                Appears normal
 Posterior Fossa:       Previously seen        Abdominal Wall:         Previously seen
 Nuchal Fold:           Not applicable (>20    Cord Vessels:           Previously seen
                        wks GA)
 Face:                  Orbits and profile     Kidneys:                Appear normal
                        previously seen
 Lips:                  Previously seen        Bladder:                Appears normal
 Palate:                Not well visualized    Spine:                  Previously seen
 Heart:                 Previously seen        Upper Extremities:      Previously seen
 RVOT:                  Previously seen        Lower Extremities:      Previously seen
 LVOT:                  Previously seen

 Other:  Female gender previously seen. Nasal bone, and Heels previously
         visualized. Hands and feet previously visualized.
Doppler - Fetal Vessels

 Umbilical Artery
  S/D     %tile
 3.43       83
Cervix Uterus Adnexa

 Cervix
 Not visualized (advanced GA >70wks)
Impression

 Fetal growth restriction
 Normal UA doppplers
 BPP w/NST [DATE]
Recommendations

 Repeat growth in 4 weeks
 Initiate weekly BPP with NST.

## 2021-05-12 MED ORDER — BUPROPION HCL ER (XL) 150 MG PO TB24: 150.0000 mg | ORAL_TABLET | ORAL | 3 refills | Status: DC

## 2021-05-12 MED ORDER — BUSPIRONE HCL 7.5 MG PO TABS: 7.5000 mg | ORAL_TABLET | Freq: Two times a day (BID) | ORAL | 3 refills | Status: DC

## 2021-05-12 MED ORDER — FLUOXETINE HCL 40 MG PO CAPS: 40.0000 mg | ORAL_CAPSULE | Freq: Every day | ORAL | 3 refills | Status: DC

## 2021-05-12 MED ORDER — FLUOXETINE HCL 20 MG PO CAPS: 20.0000 mg | ORAL_CAPSULE | Freq: Every day | ORAL | 3 refills | Status: DC

## 2021-05-12 NOTE — Progress Notes (Signed)
?  HPI with pertinent ROS:  ? ?CC: mood follow up ? ?HPI: ?Pleasant 36 year old female presenting today for mood follow up. She is currently taking Fluoxetine 60mg , Wellbutrin 150mg , and BuSpar 7.5mg  daily, tolerating well without side effects. Feels that her mood is doing well and she is not having any issues with severe anxiety or panic attacks. Denies SI/HI. ? ?Had a fall in the shower last week due to lingering post stroke weakness and losing her balance. She did get a couple of bruises but had no other injury. Did not hit her head.  ? ?Is interested in weight loss assistance. She has seen a lot about Wegovy and Mounjaro online and wants to know if it's something she can have prescribed. Working up to exercising regularly. Goes to the gym a couple of time a week and walks on the treadmill for about 20-30 minutes or so. No current strength training. Does not weigh her foods or log foods into a tracker app. Tries to limit her portions but "eyeballs" them. Admits that she is probably not getting enough protein.  ? ?I reviewed the past medical history, family history, social history, surgical history, and allergies today and no changes were needed.  Please see the problem list section below in epic for further details. ? ? ?Physical exam:  ? ?General: Well Developed, well nourished, and in no acute distress.  ?Neuro: Alert and oriented x3.  ?HEENT: Normocephalic, atraumatic.  ?Skin: Warm and dry. ?Cardiac: Regular rate and rhythm, no murmurs rubs or gallops, no lower extremity edema.  ?Respiratory: Clear to auscultation bilaterally. Not using accessory muscles, speaking in full sentences. ? ?Impression and Recommendations:   ? ?1. GAD (generalized anxiety disorder) ?2. Mild episode of recurrent major depressive disorder ([place]) ?Stable. Continue Wellbutrin, Fluoxetine, and BuSpar as prescribed.  ? ?3. Morbid obesity ([place]) ?Discussed options to help with weight loss. Unfortunately, she has Medicaid/Medicare and weight  loss medications are a plan exclusion. Discussed use of Wellbutrin as an adjunct for weight loss. Since she would likely benefit from a better understanding of nutrition and healthy behaviors, referring to Healthy Weight and Wellness.  ?- Amb Ref to Medical Weight Management ? ?4. Fall in shower ?No significant injury. Recommend strengthening exercises to help with balance and fall prevention.  ? ?5. Needs flu shot ?Flu vaccine given in office.  ?- Flu Vaccine QUAD 6+ mos PF IM (Fluarix Quad PF) ? ?Return in about 6 months (around 11/12/2021) for chronic disease follow up. ?___________________________________________ ?Clearnce Sorrel, DNP, APRN, FNP-BC ?Primary Care and Sports Medicine ?[place] ?

## 2021-06-15 IMAGING — US US MFM UA CORD DOPPLER
1 series · 14 of 28 positions shown · non-contrast
Comparison: none

[Series 1: us mfm ua cord doppler · 30 acquisitions, 14 frames shown]
[im 2/30]
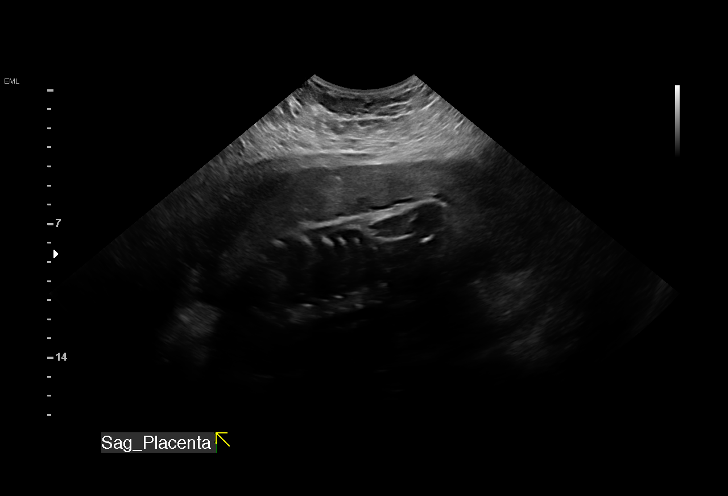
[im 4/30]
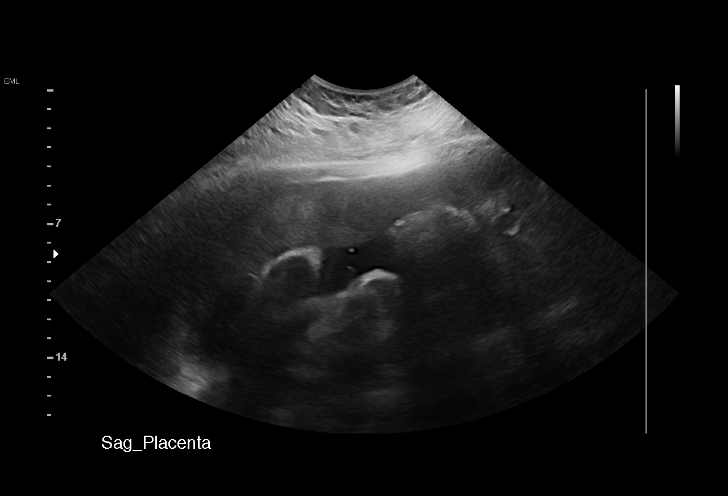
[im 6/30]
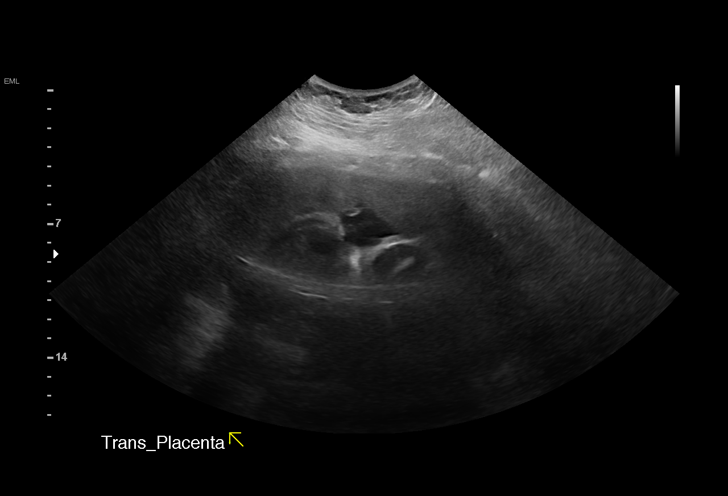
[im 8/30]
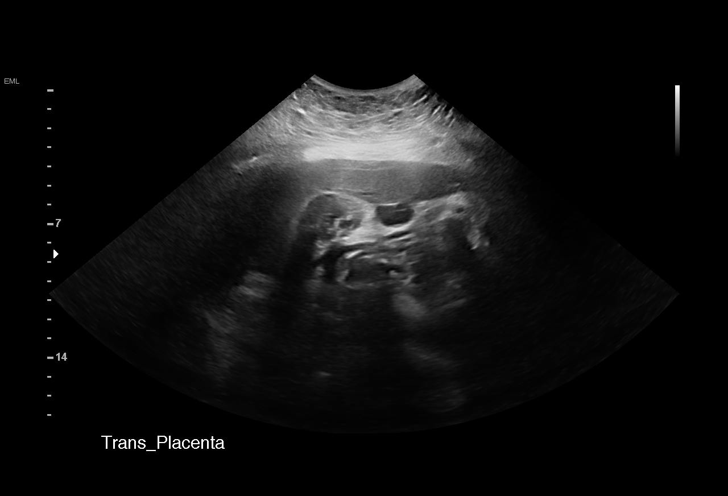
[im 10/30]
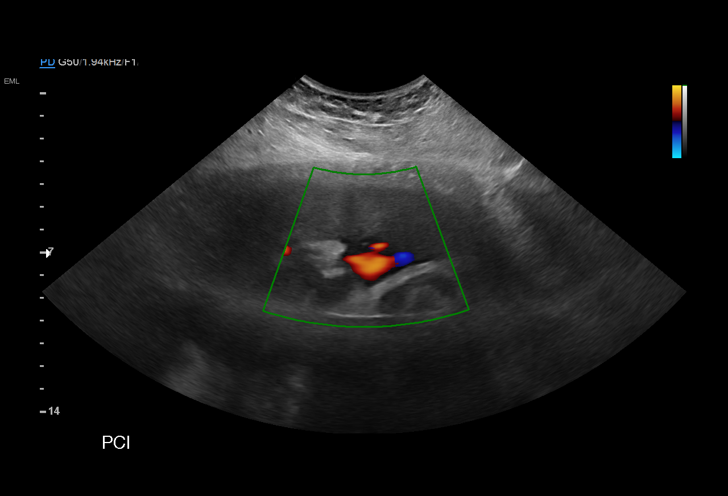
[im 12/30]
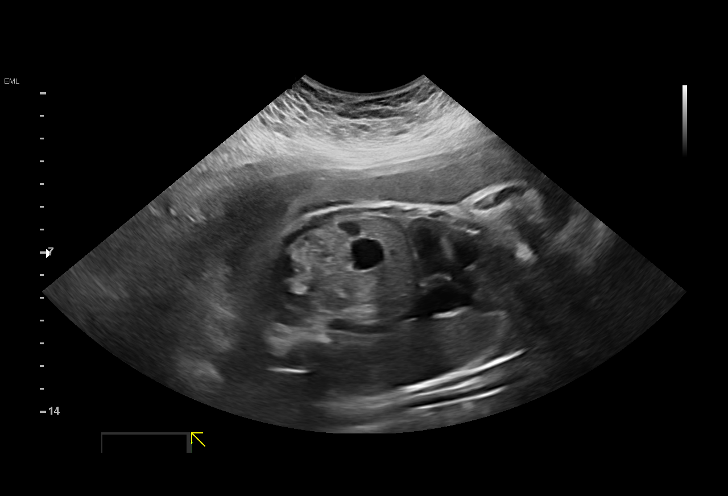
[im 14/30]
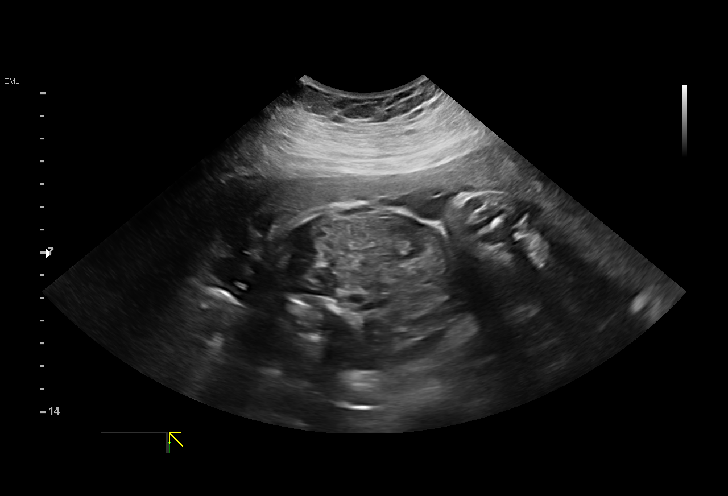
[im 17/30]
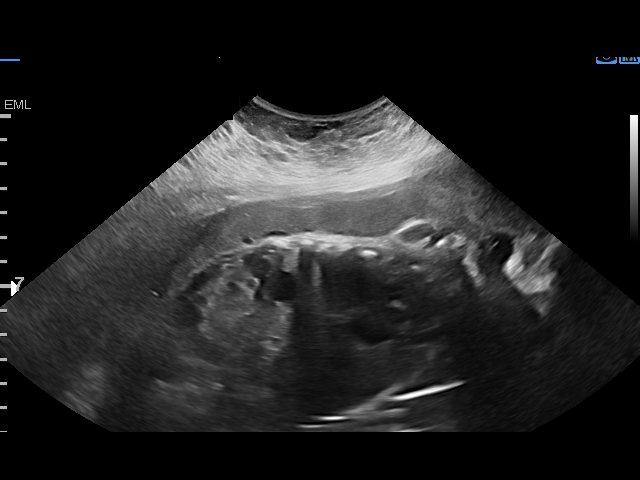
[im 19/30]
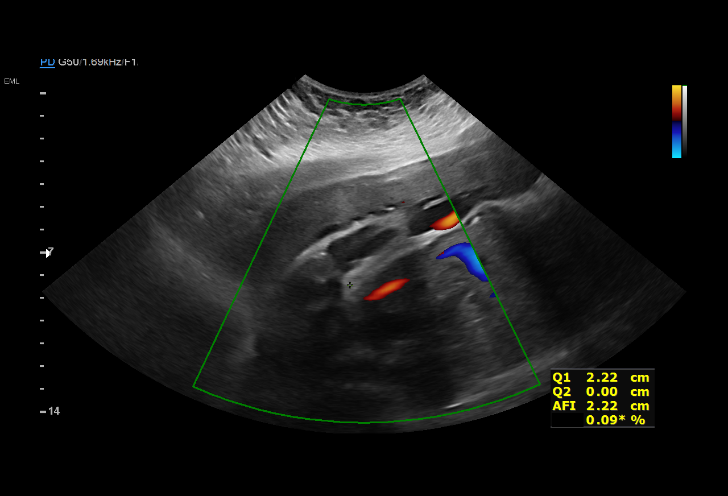
[im 21/30]
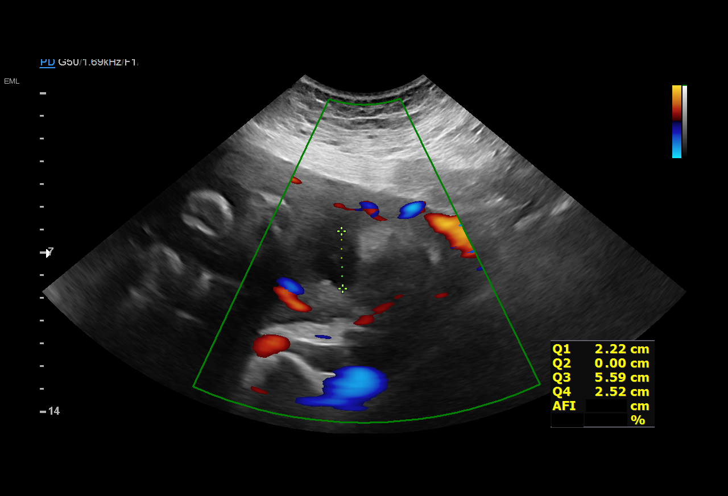
[im 23/30]
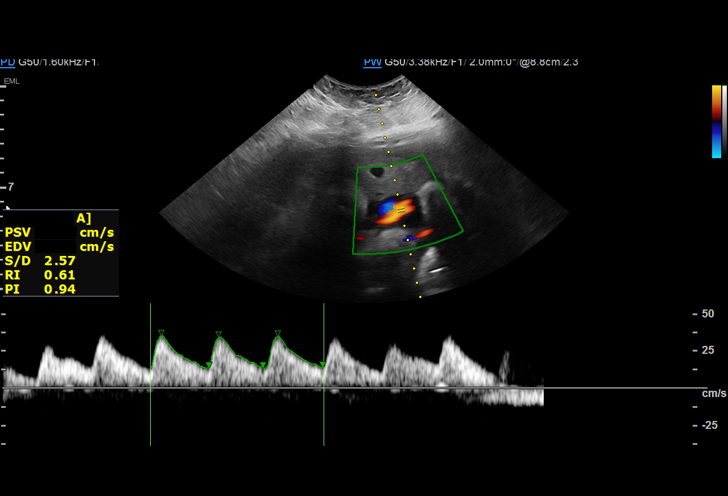
[im 25/30]
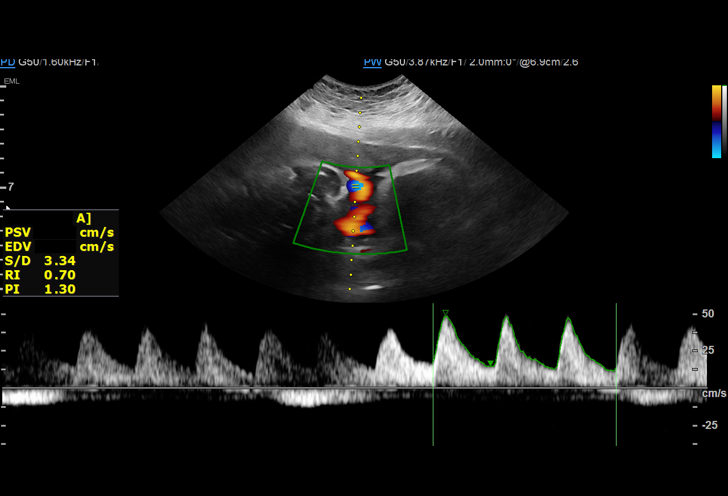
[im 27/30]
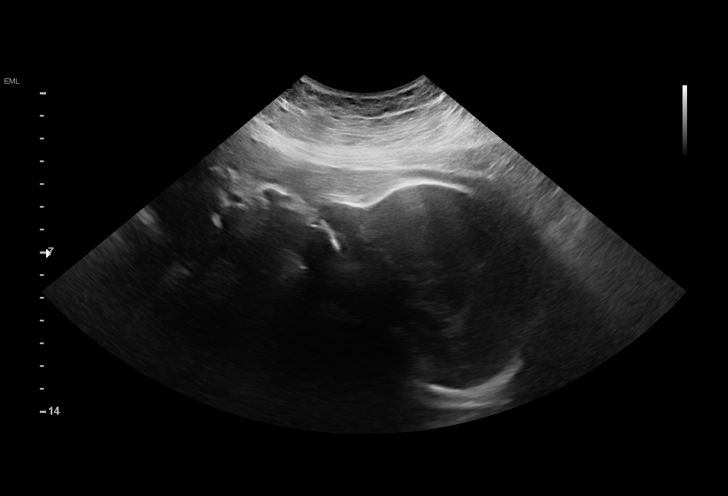
[im 30/30]
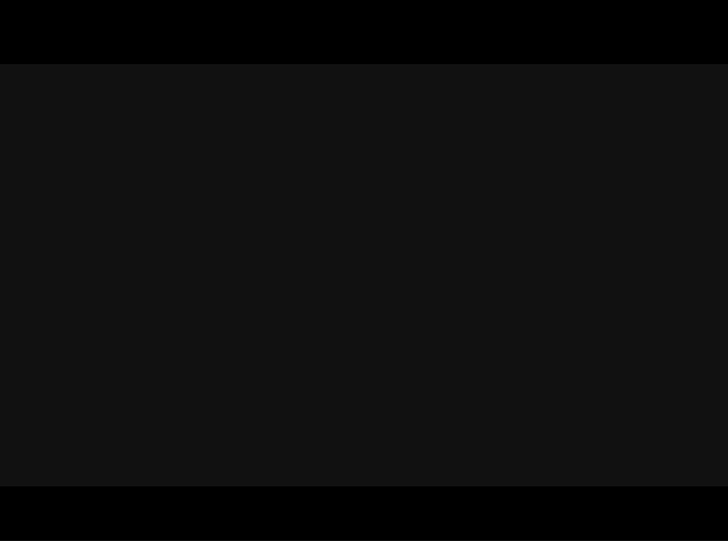

[14 of 28 positions shown; findings below may reference images not displayed]

W/NONSTRESS
 ----------------------------------------------------------------------

 ----------------------------------------------------------------------
Indications

  36 weeks gestation of pregnancy
  Late prenatal care, second trimester
  Encounter for antenatal screening for
  malformations
  Pregnancy complicated by previous gastric
  bypass, antepartum, third trimester
  Obesity complicating pregnancy, third
  trimester
  Medical complication of pregnancy (CVA
  secondary to Adderall)
  Maternal care for known or suspected poor
  fetal growth, third trimester,IUGR
 ----------------------------------------------------------------------
Vital Signs

 BMI:
Fetal Evaluation

 Num Of Fetuses:         1
 Fetal Heart Rate(bpm):  138
 Cardiac Activity:       Observed
 Presentation:           Cephalic
 Placenta:               Anterior
 P. Cord Insertion:      Visualized, central
 Amniotic Fluid
 AFI FV:      Within normal limits

 AFI Sum(cm)     %Tile       Largest Pocket(cm)
 10.33           24

 RUQ(cm)       RLQ(cm)       LUQ(cm)        LLQ(cm)
 2.22          2.52          0
Biophysical Evaluation

 Amniotic F.V:   Within normal limits       F. Tone:        Observed
 F. Movement:    Observed                   Score:          [DATE]
 F. Breathing:   Observed
OB History

 Gravidity:    3         Term:   2
Gestational Age

 LMP:           37w 6d        Date:  01/24/18                 EDD:   10/31/18
 Best:          36w 0d     Det. By:  U/S  (06/27/18)          EDD:   11/13/18
Anatomy

 Diaphragm:             Appears normal         Kidneys:                Appear normal
 Stomach:               Appears normal, left   Bladder:                Appears normal
                        sided
Doppler - Fetal Vessels

 Umbilical Artery
  S/D     %tile     RI              PI                     ADFV    RDFV
 2.93       80   0.66             1.13                        No      No

Impression

 Biophysical profile [DATE]
 Normal UA Doppler
 Known IUGR
Recommendations

 Continue weekly testing with UA Dopplers
 Follow up growth in 3 weeks.

## 2021-10-27 ENCOUNTER — Encounter: Payer: Self-pay | Admitting: General Practice

## 2021-11-12 ENCOUNTER — Encounter: Payer: Self-pay | Admitting: Medical-Surgical

## 2021-11-12 ENCOUNTER — Ambulatory Visit (INDEPENDENT_AMBULATORY_CARE_PROVIDER_SITE_OTHER): Payer: Medicare HMO | Admitting: Medical-Surgical

## 2021-11-12 VITALS — BP 103/71 | HR 86 | Resp 20 | Ht 64.0 in | Wt 313.1 lb

## 2021-11-12 DIAGNOSIS — I10 Essential (primary) hypertension: Secondary | ICD-10-CM | POA: Diagnosis not present

## 2021-11-12 DIAGNOSIS — O3507X0 Maternal care for (suspected) central nervous system malformation or damage in fetus, microcephaly, not applicable or unspecified: Secondary | ICD-10-CM | POA: Insufficient documentation

## 2021-11-12 DIAGNOSIS — E785 Hyperlipidemia, unspecified: Secondary | ICD-10-CM

## 2021-11-12 DIAGNOSIS — G4733 Obstructive sleep apnea (adult) (pediatric): Secondary | ICD-10-CM | POA: Diagnosis not present

## 2021-11-12 DIAGNOSIS — F411 Generalized anxiety disorder: Secondary | ICD-10-CM

## 2021-11-12 DIAGNOSIS — F330 Major depressive disorder, recurrent, mild: Secondary | ICD-10-CM

## 2021-11-12 MED ORDER — BUPROPION HCL ER (XL) 150 MG PO TB24: 150.0000 mg | ORAL_TABLET | ORAL | 3 refills | Status: DC

## 2021-11-12 MED ORDER — FLUOXETINE HCL 20 MG PO CAPS: 20.0000 mg | ORAL_CAPSULE | Freq: Every day | ORAL | 3 refills | Status: DC

## 2021-11-12 MED ORDER — FLUOXETINE HCL 40 MG PO CAPS: 40.0000 mg | ORAL_CAPSULE | Freq: Every day | ORAL | 3 refills | Status: DC

## 2021-11-12 MED ORDER — BUSPIRONE HCL 7.5 MG PO TABS: 7.5000 mg | ORAL_TABLET | Freq: Two times a day (BID) | ORAL | 3 refills | Status: DC

## 2021-11-12 NOTE — Progress Notes (Signed)
   Established Patient Office Visit  Subjective   Patient ID: Dorothy Marquez, female   DOB: January 21, 1986 Age: 36 y.o. MRN: [id number]   No chief complaint on file.   HPI Pleasant 36 year old female presenting today for the following:  HTN:  Mood:  HLD:   Objective:    There were no vitals filed for this visit.  Physical Exam   No results found for this or any previous visit (from the past 24 hour(s)).   {Labs (Optional):23779}  The ASCVD Risk score (Arnett DK, et al., 2019) failed to calculate for the following reasons:   The 2019 ASCVD risk score is only valid for ages 31 to 51   The patient has a prior MI or stroke diagnosis   Assessment & Plan:   No problem-specific Assessment & Plan notes found for this encounter.   No follow-ups on file.  ___________________________________________ Thayer Ohm, DNP, APRN, FNP-BC Primary Care and Sports Medicine [facility] [place]

## 2021-11-13 NOTE — Assessment & Plan Note (Signed)
Symptoms stable.  Patient does feel like they are manageable at this point so we will avoid making changes to her medication regimen.  Continue fluoxetine and Wellbutrin as prescribed.  Consider increasing BuSpar to twice daily dosing.

## 2021-11-13 NOTE — Assessment & Plan Note (Signed)
Blood pressure at goal without medications.  Recommend regular intentional exercise, weight loss to healthy weight, and a low-sodium heart healthy diet.

## 2021-11-13 NOTE — Assessment & Plan Note (Signed)
Checking lipid panel.  Recommend low-fat diet, regular intentional exercise, and weight loss to healthy weight.

## 2021-11-13 NOTE — Assessment & Plan Note (Signed)
>>  ASSESSMENT AND PLAN FOR DYSLIPIDEMIA WRITTEN ON 11/13/2021 10:12 AM BY Camala Talwar, NP  Checking lipid panel.  Recommend low-fat diet, regular intentional exercise, and weight loss to healthy weight.

## 2021-11-13 NOTE — Assessment & Plan Note (Signed)
Since this issue was mild, recommend using sleep hygiene and lifestyle modifications to manage symptoms.  If symptoms do worsen or she experiences worsening daytime fatigue, we may need to repeat her sleep study.

## 2021-11-13 NOTE — Assessment & Plan Note (Signed)
Stable.  Continue current medications.

## 2021-11-15 LAB — COMPLETE METABOLIC PANEL WITH GFR
AG Ratio: 1.4 (calc) (ref 1.0–2.5)
ALT: 8 U/L (ref 6–29)
AST: 12 U/L (ref 10–30)
Albumin: 4 g/dL (ref 3.6–5.1)
Alkaline phosphatase (APISO): 72 U/L (ref 31–125)
BUN: 9 mg/dL (ref 7–25)
CO2: 24 mmol/L (ref 20–32)
Calcium: 9.6 mg/dL (ref 8.6–10.2)
Chloride: 106 mmol/L (ref 98–110)
Creat: 0.65 mg/dL (ref 0.50–0.97)
Globulin: 2.9 g/dL (calc) (ref 1.9–3.7)
Glucose, Bld: 74 mg/dL (ref 65–99)
Potassium: 4.4 mmol/L (ref 3.5–5.3)
Sodium: 139 mmol/L (ref 135–146)
Total Bilirubin: 0.3 mg/dL (ref 0.2–1.2)
Total Protein: 6.9 g/dL (ref 6.1–8.1)
eGFR: 117 mL/min/{1.73_m2} (ref >=60)

## 2021-11-15 LAB — CBC WITH DIFFERENTIAL/PLATELET
Absolute Monocytes: 706 cells/uL (ref 200–950)
Basophils Absolute: 29 cells/uL (ref 0–200)
Basophils Relative: 0.4 %
Eosinophils Absolute: 58 cells/uL (ref 15–500)
Eosinophils Relative: 0.8 %
HCT: 37.6 % (ref 35.0–45.0)
Hemoglobin: 11.5 g/dL — ABNORMAL LOW (ref 11.7–15.5)
Lymphs Abs: 1469 cells/uL (ref 850–3900)
MCH: 24.1 pg — ABNORMAL LOW (ref 27.0–33.0)
MCHC: 30.6 g/dL — ABNORMAL LOW (ref 32.0–36.0)
MCV: 78.8 fL — ABNORMAL LOW (ref 80.0–100.0)
MPV: 11 fL (ref 7.5–12.5)
Monocytes Relative: 9.8 %
Neutro Abs: 4939 cells/uL (ref 1500–7800)
Neutrophils Relative %: 68.6 %
Platelets: 394 10*3/uL (ref 140–400)
RBC: 4.77 10*6/uL (ref 3.80–5.10)
RDW: 16.8 % — ABNORMAL HIGH (ref 11.0–15.0)
Total Lymphocyte: 20.4 %
WBC: 7.2 10*3/uL (ref 3.8–10.8)

## 2021-11-15 LAB — TEST AUTHORIZATION

## 2021-11-15 LAB — IRON,TIBC AND FERRITIN PANEL
%SAT: 6 % (calc) — ABNORMAL LOW (ref 16–45)
Ferritin: 4 ng/mL — ABNORMAL LOW (ref 16–154)
Iron: 19 ug/dL — ABNORMAL LOW (ref 40–190)
TIBC: 340 mcg/dL (calc) (ref 250–450)

## 2021-11-15 LAB — LIPID PANEL
Cholesterol: 195 mg/dL (ref <200)
HDL: 63 mg/dL (ref >=50)
LDL Cholesterol (Calc): 116 mg/dL (calc) — ABNORMAL HIGH
Non-HDL Cholesterol (Calc): 132 mg/dL (calc) — ABNORMAL HIGH (ref <130)
Total CHOL/HDL Ratio: 3.1 (calc) (ref <5.0)
Triglycerides: 72 mg/dL (ref <150)

## 2021-12-14 ENCOUNTER — Encounter: Payer: Medicare HMO | Admitting: Medical-Surgical

## 2021-12-14 NOTE — Progress Notes (Unsigned)
   Complete physical exam  Patient: Dorothy Marquez   DOB: 12-31-1985   36 y.o. Female  MRN: [id number]  Subjective:    No chief complaint on file.   Gennett Rolle is a 36 y.o. female who presents today for a complete physical exam. She reports consuming a {diet types:17450} diet. {types:19826} She generally feels {DESC; WELL/FAIRLY WELL/POORLY:18703}. She reports sleeping {DESC; WELL/FAIRLY WELL/POORLY:18703}. She {does/does not:200015} have additional problems to discuss today.    Most recent fall risk assessment:    05/12/2021   11:35 AM  Fall Risk   Falls in the past year? 0  Number falls in past yr: 0  Injury with Fall? 0  Risk for fall due to : No Fall Risks  Follow up Falls evaluation completed     Most recent depression screenings:    11/12/2021    4:52 PM 05/12/2021   11:35 AM  PHQ 2/9 Scores  PHQ - 2 Score 3 2  PHQ- 9 Score 10 11    {VISON DENTAL STD PSA (Optional):27386}  {History (Optional):23778}  Patient Care Team: Christen Butter, NP as PCP - General (Nurse Practitioner)   Outpatient Medications Prior to Visit  Medication Sig   buPROPion (WELLBUTRIN XL) 150 MG 24 hr tablet Take 1 tablet (150 mg total) by mouth every morning.   busPIRone (BUSPAR) 7.5 MG tablet Take 1-2 tablets (7.5-15 mg total) by mouth 2 (two) times daily.   FLUoxetine (PROZAC) 20 MG capsule Take 1 capsule (20 mg total) by mouth daily. 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   FLUoxetine (PROZAC) 40 MG capsule Take 1 capsule (40 mg total) by mouth daily.   No facility-administered medications prior to visit.    ROS        Objective:     There were no vitals taken for this visit. {Vitals History (Optional):23777}  Physical Exam   No results found for any visits on 12/14/21. {Show previous labs (optional):23779}    Assessment & Plan:    Routine Health Maintenance and Physical Exam  Immunization History  Administered Date(s) Administered   Influenza,inj,Quad PF,6+ Mos 05/18/2015,  06/07/2018, 05/12/2021   Influenza,inj,quad, With Preservative 12/05/2016   Influenza-Unspecified 12/05/2016, 12/07/2018, 05/12/2021   Tdap 08/09/2016, 08/10/2018    Health Maintenance  Topic Date Due   COVID-19 Vaccine (1) Never done   PAP SMEAR-Modifier  06/06/2021   Hepatitis C Screening  05/13/2022 (Originally 11/07/2003)   TETANUS/TDAP  08/09/2028   HIV Screening  Completed   HPV VACCINES  Aged Out   INFLUENZA VACCINE  Discontinued    Discussed health benefits of physical activity, and encouraged her to engage in regular exercise appropriate for her age and condition.  Problem List Items Addressed This Visit   None Visit Diagnoses     Annual physical exam    -  Primary   Cervical cancer screening          No follow-ups on file.     Christen Butter, NP

## 2022-04-04 ENCOUNTER — Telehealth: Payer: Self-pay | Admitting: Medical-Surgical

## 2022-04-04 NOTE — Telephone Encounter (Signed)
Left voicemail for patient to schedule annual wellness visit. -MAJ 

## 2022-05-06 ENCOUNTER — Ambulatory Visit (INDEPENDENT_AMBULATORY_CARE_PROVIDER_SITE_OTHER): Payer: Medicare HMO | Admitting: Medical-Surgical

## 2022-05-06 DIAGNOSIS — Z0000 Encounter for general adult medical examination without abnormal findings: Secondary | ICD-10-CM

## 2022-05-06 NOTE — Patient Instructions (Signed)
[facility] Maintenance Summary and Written Plan of Care  Dorothy Marquez ,  Thank you for allowing me to perform your Medicare Annual Wellness Visit and for your ongoing commitment to your health.   Health Maintenance & Immunization History Health Maintenance  Topic Date Due   PAP SMEAR-Modifier  05/06/2022 (Originally 06/06/2021)   Hepatitis C Screening  05/13/2022 (Originally 11/07/2003)   COVID-19 Vaccine (1) 05/22/2022 (Originally [birth year]/1986)   Medicare Annual Wellness (AWV)  05/06/2023   DTaP/Tdap/Td (3 - Td or Tdap) 08/09/2028   HIV Screening  Completed   HPV VACCINES  Aged Out   INFLUENZA VACCINE  Discontinued   Immunization History  Administered Date(s) Administered   Influenza,inj,Quad PF,6+ Mos 05/18/2015, 06/07/2018, 05/12/2021   Influenza,inj,quad, With Preservative 12/05/2016   Influenza-Unspecified 12/05/2016, 12/07/2018, 05/12/2021   Tdap 08/09/2016, 08/10/2018    These are the patient goals that we discussed:  Goals Addressed               This Visit's Progress     Patient Stated (pt-stated)        Patient stated that she would like to loose weight.         This is a list of Health Maintenance Items that are overdue or due now: Screening Pap smear and pelvic exam     Orders/Referrals Placed Today: No orders of the defined types were placed in this encounter.  (Contact our referral department at [phone] if you have not spoken with someone about your referral appointment within the next 5 days)    Follow-up Plan Follow-up with Samuel Bouche, NP as planned Schedule your appointment for pap smear. Medicare wellness visit in one year.  Patient will access AVS on my chart.      Health Maintenance, Female Adopting a healthy lifestyle and getting preventive care are important in promoting health and wellness. Ask your health care provider about: The right schedule for you to have regular tests and exams. Things you can do on  your own to prevent diseases and keep yourself healthy. What should I know about diet, weight, and exercise? Eat a healthy diet  Eat a diet that includes plenty of vegetables, fruits, low-fat dairy products, and lean protein. Do not eat a lot of foods that are high in solid fats, added sugars, or sodium. Maintain a healthy weight Body mass index (BMI) is used to identify weight problems. It estimates body fat based on height and weight. Your health care provider can help determine your BMI and help you achieve or maintain a healthy weight. Get regular exercise Get regular exercise. This is one of the most important things you can do for your health. Most adults should: Exercise for at least 150 minutes each week. The exercise should increase your heart rate and make you sweat (moderate-intensity exercise). Do strengthening exercises at least twice a week. This is in addition to the moderate-intensity exercise. Spend less time sitting. Even light physical activity can be beneficial. Watch cholesterol and blood lipids Have your blood tested for lipids and cholesterol at 37 years of age, then have this test every 5 years. Have your cholesterol levels checked more often if: Your lipid or cholesterol levels are high. You are older than 37 years of age. You are at high risk for heart disease. What should I know about cancer screening? Depending on your health history and family history, you may need to have cancer screening at various ages. This may include screening for: Breast cancer.  Cervical cancer. Colorectal cancer. Skin cancer. Lung cancer. What should I know about heart disease, diabetes, and high blood pressure? Blood pressure and heart disease High blood pressure causes heart disease and increases the risk of stroke. This is more likely to develop in people who have high blood pressure readings or are overweight. Have your blood pressure checked: Every 3-5 years if you are 84-61  years of age. Every year if you are 38 years old or older. Diabetes Have regular diabetes screenings. This checks your fasting blood sugar level. Have the screening done: Once every three years after age 13 if you are at a normal weight and have a low risk for diabetes. More often and at a younger age if you are overweight or have a high risk for diabetes. What should I know about preventing infection? Hepatitis B If you have a higher risk for hepatitis B, you should be screened for this virus. Talk with your health care provider to find out if you are at risk for hepatitis B infection. Hepatitis C Testing is recommended for: Everyone born from [birth year] through 1965. Anyone with known risk factors for hepatitis C. Sexually transmitted infections (STIs) Get screened for STIs, including gonorrhea and chlamydia, if: You are sexually active and are younger than 37 years of age. You are older than 37 years of age and your health care provider tells you that you are at risk for this type of infection. Your sexual activity has changed since you were last screened, and you are at increased risk for chlamydia or gonorrhea. Ask your health care provider if you are at risk. Ask your health care provider about whether you are at high risk for HIV. Your health care provider may recommend a prescription medicine to help prevent HIV infection. If you choose to take medicine to prevent HIV, you should first get tested for HIV. You should then be tested every 3 months for as long as you are taking the medicine. Pregnancy If you are about to stop having your period (premenopausal) and you may become pregnant, seek counseling before you get pregnant. Take 400 to 800 micrograms (mcg) of folic acid every day if you become pregnant. Ask for birth control (contraception) if you want to prevent pregnancy. Osteoporosis and menopause Osteoporosis is a disease in which the bones lose minerals and strength with aging. This  can result in bone fractures. If you are 64 years old or older, or if you are at risk for osteoporosis and fractures, ask your health care provider if you should: Be screened for bone loss. Take a calcium or vitamin D supplement to lower your risk of fractures. Be given hormone replacement therapy (HRT) to treat symptoms of menopause. Follow these instructions at home: Alcohol use Do not drink alcohol if: Your health care provider tells you not to drink. You are pregnant, may be pregnant, or are planning to become pregnant. If you drink alcohol: Limit how much you have to: 0-1 drink a day. Know how much alcohol is in your drink. In the [place] one drink equals one 12 oz bottle of beer (355 mL), one 5 oz glass of wine (148 mL), or one 1 oz glass of hard liquor (44 mL). Lifestyle Do not use any products that contain nicotine or tobacco. These products include cigarettes, chewing tobacco, and vaping devices, such as e-cigarettes. If you need help quitting, ask your health care provider. Do not use street drugs. Do not share needles. Ask your health care provider  for help if you need support or information about quitting drugs. General instructions Schedule regular health, dental, and eye exams. Stay current with your vaccines. Tell your health care provider if: You often feel depressed. You have ever been abused or do not feel safe at home. Summary Adopting a healthy lifestyle and getting preventive care are important in promoting health and wellness. Follow your health care provider's instructions about healthy diet, exercising, and getting tested or screened for diseases. Follow your health care provider's instructions on monitoring your cholesterol and blood pressure. This information is not intended to replace advice given to you by your health care provider. Make sure you discuss any questions you have with your health care provider. Document Revised: 07/13/2020 Document Reviewed:  07/13/2020 Elsevier Patient Education  [place].

## 2022-05-06 NOTE — Progress Notes (Signed)
MEDICARE ANNUAL WELLNESS VISIT  05/06/2022  Telephone Visit Disclaimer This Medicare AWV was conducted by telephone due to national recommendations for restrictions regarding the COVID-19 Pandemic (e.g. social distancing).  I verified, using two identifiers, that I am speaking with Dorothy Marquez or their authorized healthcare agent. I discussed the limitations, risks, security, and privacy concerns of performing an evaluation and management service by telephone and the potential availability of an in-person appointment in the future. The patient expressed understanding and agreed to proceed.  Location of Patient: Home Location of Provider (nurse):  In the office  Subjective:    Dorothy Marquez is a 37 y.o. female patient of Samuel Bouche, NP who had a Medicare Annual Wellness Visit today via telephone. Ashlea is Disabled and lives with their family. she has 3 children. she reports that she is socially active and does interact with friends/family regularly. she is moderately physically active and enjoys spending time with children.  Patient Care Team: Samuel Bouche, NP as PCP - General (Nurse Practitioner)     05/06/2022    2:15 PM 10/26/2018    7:41 AM 05/18/2015    9:37 AM 08/25/2014    3:44 PM 08/25/2014    9:25 AM 08/21/2014    9:31 AM 02/08/2014    2:24 PM  Advanced Directives  Does Patient Have a Medical Advance Directive? Yes No No No  No No  Type of Advance Directive Living will        Does patient want to make changes to medical advance directive? No - Patient declined        Would patient like information on creating a medical advance directive?  No - Patient declined Yes - Scientist, clinical (histocompatibility and immunogenetics) given No - patient declined information No - patient declined information No - patient declined information Yes - Educational materials given    Hospital Utilization Over the Past 12 Months: # of hospitalizations or ER visits: 0 # of surgeries: 0  Review of Systems    Patient reports that  her overall health is better compared to last year.  History obtained from chart review and the patient  Patient Reported Readings (BP, Pulse, CBG, Weight, etc) none  Pain Assessment Pain : No/denies pain     Current Medications & Allergies (verified) Allergies as of 05/06/2022   No Known Allergies      Medication List        Accurate as of May 06, 2022  2:24 PM. If you have any questions, ask your nurse or doctor.          buPROPion 150 MG 24 hr tablet Commonly known as: Wellbutrin XL Take 1 tablet (150 mg total) by mouth every morning.   busPIRone 7.5 MG tablet Commonly known as: BUSPAR Take 1-2 tablets (7.5-15 mg total) by mouth 2 (two) times daily.   FLUoxetine 20 MG capsule Commonly known as: PROZAC Take 1 capsule (20 mg total) by mouth daily. 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   FLUoxetine 40 MG capsule Commonly known as: PROZAC Take 1 capsule (40 mg total) by mouth daily.        History (reviewed): Past Medical History:  Diagnosis Date   Anxiety    Depression    History of hiatal hernia    History of kidney stones    x5 episodes   Stroke [facility])    Past Surgical History:  Procedure Laterality Date   LAPAROSCOPIC GASTRIC SLEEVE RESECTION WITH HIATAL HERNIA REPAIR N/A 08/25/2014  Procedure: LAPAROSCOPIC GASTRIC SLEEVE RESECTION WITH HIATAL HERNIA REPAIR;  Surgeon: Greer Pickerel, MD;  Location: [facility];  Service: General;  Laterality: N/A;   UPPER GI ENDOSCOPY N/A 08/25/2014   Procedure: UPPER GI ENDOSCOPY;  Surgeon: Greer Pickerel, MD;  Location: [facility];  Service: General;  Laterality: N/A;   WISDOM TOOTH EXTRACTION     Family History  Problem Relation Age of Onset   Depression Mother    Diabetes Mother    Depression Brother    Hyperlipidemia Brother    Diabetes Maternal Aunt    Depression Maternal Aunt    Cancer Maternal Aunt        uterine   Alcohol abuse Maternal Uncle    Social History   Socioeconomic History   Marital status: Single     Spouse name: Not on file   Number of children: 3   Years of education: 12   Highest education level: 12th grade  Occupational History   Occupation: disabled  Tobacco Use   Smoking status: Never   Smokeless tobacco: Never  Vaping Use   Vaping Use: Never used  Substance and Sexual Activity   Alcohol use: Not Currently    Comment: rare- social   Drug use: No   Sexual activity: Yes    Birth control/protection: None  Other Topics Concern   Not on file  Social History Narrative   Lives with her children. She has three children. She enjoys spending time with her children.   Social Determinants of Health   Financial Resource Strain: Low Risk  (05/06/2022)   Overall Financial Resource Strain (CARDIA)    Difficulty of Paying Living Expenses: Not hard at all  Food Insecurity: No Food Insecurity (05/06/2022)   Hunger Vital Sign    Worried About Running Out of Food in the Last Year: Never true    Ran Out of Food in the Last Year: Never true  Transportation Needs: No Transportation Needs (05/06/2022)   PRAPARE - Hydrologist (Medical): No    Lack of Transportation (Non-Medical): No  Physical Activity: Sufficiently Active (05/06/2022)   Exercise Vital Sign    Days of Exercise per Week: 4 days    Minutes of Exercise per Session: 60 min  Stress: No Stress Concern Present (05/06/2022)   [place]    Feeling of Stress : Not at all  Social Connections: Socially Isolated (05/06/2022)   Social Connection and Isolation Panel [NHANES]    Frequency of Communication with Friends and Family: Never    Frequency of Social Gatherings with Friends and Family: More than three times a week    Attends Religious Services: Never    Marine scientist or Organizations: No    Attends Archivist Meetings: Never    Marital Status: Divorced    Activities of Daily Living    05/06/2022    2:15 PM  In your  present state of health, do you have any difficulty performing the following activities:  Hearing? 0  Vision? 0  Difficulty concentrating or making decisions? 1  Comment sometimes  Walking or climbing stairs? 0  Dressing or bathing? 0  Doing errands, shopping? 0  Preparing Food and eating ? N  Using the Toilet? N  In the past six months, have you accidently leaked urine? N  Do you have problems with loss of bowel control? N  Managing your Medications? N  Managing your Finances? N  Housekeeping or managing your Housekeeping? N    Patient Education/ Literacy How often do you need to have someone help you when you read instructions, pamphlets, or other written materials from your doctor or pharmacy?: 1 - Never What is the last grade level you completed in school?: 12th grade  Exercise Current Exercise Habits: Home exercise routine, Type of exercise: walking, Time (Minutes): 60, Frequency (Times/Week): 4, Weekly Exercise (Minutes/Week): 240, Intensity: Moderate, Exercise limited by: orthopedic condition(s)  Diet Patient reports consuming 2 meals a day and 1 snack(s) a day Patient reports that her primary diet is: Regular Patient reports that she does have regular access to food.   Depression Screen    05/06/2022    2:15 PM 11/12/2021    4:52 PM 05/12/2021   11:35 AM 03/02/2021    3:20 PM 08/13/2020    8:24 AM 05/09/2019    1:29 PM 04/11/2019    2:49 PM  PHQ 2/9 Scores  PHQ - 2 Score 0 '3 2 4 '$ 0 3 4  PHQ- 9 Score  '10 11 19  11 12     '$ Fall Risk    05/06/2022    2:15 PM 05/12/2021   11:35 AM 05/12/2021   10:57 AM 03/02/2021    3:20 PM 03/02/2021    2:52 PM  [place] in the past year? 0 0 1 0 0  Number falls in past yr: 0 0 0 0 0  Injury with Fall? 0 0 0 0 0  Risk for fall due to : No Fall Risks No Fall Risks History of fall(s) No Fall Risks No Fall Risks  Follow up Falls evaluation completed Falls evaluation completed Falls evaluation completed Falls evaluation completed Falls  evaluation completed     Objective:  Danyele Cullipher seemed alert and oriented and she participated appropriately during our telephone visit.  Blood Pressure Weight BMI  BP Readings from Last 3 Encounters:  11/12/21 103/71  05/12/21 115/80  03/16/21 119/82   Wt Readings from Last 3 Encounters:  11/12/21 (!) 313 lb 1.6 oz (142 kg)  05/12/21 (!) 315 lb 4.8 oz (143 kg)  03/16/21 (!) 319 lb (144.7 kg)   BMI Readings from Last 1 Encounters:  11/12/21 53.74 kg/m    *Unable to obtain current vital signs, weight, and BMI due to telephone visit type  Hearing/Vision  Faithe did not seem to have difficulty with hearing/understanding during the telephone conversation Reports that she has had a formal eye exam by an eye care professional within the past year Reports that she has not had a formal hearing evaluation within the past year *Unable to fully assess hearing and vision during telephone visit type  Cognitive Function:    05/06/2022    2:21 PM  6CIT Screen  What Year? 0 points  What month? 0 points  What time? 0 points  Count back from 20 0 points  Months in reverse 0 points  Repeat phrase 2 points  Total Score 2 points   (Normal:0-7, Significant for Dysfunction: >8)  Normal Cognitive Function Screening: Yes   Immunization & Health Maintenance Record Immunization History  Administered Date(s) Administered   Influenza,inj,Quad PF,6+ Mos 05/18/2015, 06/07/2018, 05/12/2021   Influenza,inj,quad, With Preservative 12/05/2016   Influenza-Unspecified 12/05/2016, 12/07/2018, 05/12/2021   Tdap 08/09/2016, 08/10/2018    Health Maintenance  Topic Date Due   PAP SMEAR-Modifier  05/06/2022 (Originally 06/06/2021)   Hepatitis C Screening  05/13/2022 (Originally 11/07/2003)   COVID-19 Vaccine (1) 05/22/2022 (Originally 05/07/1986)  Medicare Annual Wellness (AWV)  05/06/2023   DTaP/Tdap/Td (3 - Td or Tdap) 08/09/2028   HIV Screening  Completed   HPV VACCINES  Aged Out   INFLUENZA  VACCINE  Discontinued       Assessment  This is a routine wellness examination for [employer].  Health Maintenance: Due or Overdue There are no preventive care reminders to display for this patient.   Randye Hugley does not need a referral for [employer] Assistance: Care Management:   no Social Work:    no Prescription Assistance:  no Nutrition/Diabetes Education:  no   Plan:  Personalized Goals  Goals Addressed               This Visit's Progress     Patient Stated (pt-stated)        Patient stated that she would like to loose weight.       Personalized Health Maintenance & Screening Recommendations  Screening Pap smear and pelvic exam   Lung Cancer Screening Recommended: no (Low Dose CT Chest recommended if Age 33-80 years, 30 pack-year currently smoking OR have quit w/in past 15 years) Hepatitis C Screening recommended: yes HIV Screening recommended: no  Advanced Directives: Written information was not prepared per patient's request.  Referrals & Orders No orders of the defined types were placed in this encounter.   Follow-up Plan Follow-up with Samuel Bouche, NP as planned Schedule your appointment for pap smear. Medicare wellness visit in one year.  Patient will access AVS on my chart.   I have personally reviewed and noted the following in the patient's chart:   Medical and social history Use of alcohol, tobacco or illicit drugs  Current medications and supplements Functional ability and status Nutritional status Physical activity Advanced directives List of other physicians Hospitalizations, surgeries, and ER visits in previous 12 months Vitals Screenings to include cognitive, depression, and falls Referrals and appointments  In addition, I have reviewed and discussed with Dorothy Marquez certain preventive protocols, quality metrics, and best practice recommendations. A written personalized care plan for preventive services as well as general  preventive health recommendations is available and can be mailed to the patient at her request.      Tinnie Gens, RN BSN  05/06/2022

## 2022-05-13 ENCOUNTER — Encounter: Payer: Self-pay | Admitting: Medical-Surgical

## 2022-05-13 ENCOUNTER — Ambulatory Visit (INDEPENDENT_AMBULATORY_CARE_PROVIDER_SITE_OTHER): Payer: Medicare HMO | Admitting: Medical-Surgical

## 2022-05-13 VITALS — BP 100/70 | HR 80 | Resp 20 | Ht 64.0 in | Wt 296.0 lb

## 2022-05-13 DIAGNOSIS — F330 Major depressive disorder, recurrent, mild: Secondary | ICD-10-CM

## 2022-05-13 DIAGNOSIS — R5383 Other fatigue: Secondary | ICD-10-CM | POA: Diagnosis not present

## 2022-05-13 DIAGNOSIS — F4310 Post-traumatic stress disorder, unspecified: Secondary | ICD-10-CM

## 2022-05-13 DIAGNOSIS — E611 Iron deficiency: Secondary | ICD-10-CM

## 2022-05-13 DIAGNOSIS — F39 Unspecified mood [affective] disorder: Secondary | ICD-10-CM | POA: Diagnosis not present

## 2022-05-13 DIAGNOSIS — F411 Generalized anxiety disorder: Secondary | ICD-10-CM | POA: Diagnosis not present

## 2022-05-13 NOTE — Progress Notes (Signed)
   Established Patient Office Visit  Subjective   Patient ID: Dorothy Marquez, female   DOB: 04/06/1985 Age: 37 y.o. MRN: [id number]   Chief Complaint  Patient presents with   Follow-up    MOOD   HPI Pleasant 37 year old female presenting today to follow-up on mood.  She has been taking Wellbutrin 150 mg and fluoxetine 60 mg daily.  Also has BuSpar 7.5 mg - 15 mg twice daily. Has been taking the BuSpar 15mg  once daily in the morning. Feels the medication is working well for her. Does note that she is always fatigued and oversleeping but wonders if this is from her stroke or something else. Has been taking oral iron supplements when she remembers.   Objective:    Vitals:   05/13/22 0916  BP: 100/70  Pulse: 80  Resp: 20  Height: 5\' 4"  (1.626 m)  Weight: 296 lb (134.3 kg)  SpO2: 100%  BMI (Calculated): 50.78    Physical Exam Vitals reviewed.  Constitutional:      General: She is not in acute distress.    Appearance: Normal appearance. She is obese. She is not ill-appearing.  HENT:     Head: Normocephalic and atraumatic.  Cardiovascular:     Rate and Rhythm: Normal rate and regular rhythm.     Pulses: Normal pulses.     Heart sounds: Normal heart sounds.  Pulmonary:     Effort: Pulmonary effort is normal. No respiratory distress.     Breath sounds: Normal breath sounds. No wheezing, rhonchi or rales.  Skin:    General: Skin is warm and dry.  Neurological:     Mental Status: She is alert and oriented to person, place, and time.  Psychiatric:        Mood and Affect: Mood normal.        Behavior: Behavior normal.        Thought Content: Thought content normal.        Judgment: Judgment normal.   No results found for this or any previous visit (from the past 24 hour(s)).     The ASCVD Risk score (Arnett DK, et al., 2019) failed to calculate for the following reasons:   The 2019 ASCVD risk score is only valid for ages 11 to 61   The patient has a prior MI or stroke  diagnosis   Assessment & Plan:   1. PTSD (post-traumatic stress disorder) 2. Mood disorder ([place]) 3. Mild episode of recurrent major depressive disorder ([place]) 4. GAD (generalized anxiety disorder) Stable and medications working well.  Continue current regimen of Wellbutrin, fluoxetine, and BuSpar.  5. Fatigue, unspecified type Checking labs as below.  Consider continued iron deficiency, thyroid dysfunction, vitamin deficiency, or sequela to her prior CVA.  Recommend continued healthy diet, weight loss efforts, and regular exercise. - Iron, TIBC and Ferritin Panel - TSH - VITAMIN D 25 Hydroxy (Vit-D Deficiency, Fractures) - Vitamin B12  6. Iron deficiency Today.  Currently on oral iron replacement and notes that she is taking when she remembers.  May need referral to hematology if not having adequate response. - Iron, TIBC and Ferritin Panel  Return in about 6 months (around 11/13/2022) for mood follow up.  ___________________________________________ Clearnce Sorrel, DNP, APRN, FNP-BC Primary Care and [place]

## 2022-05-14 LAB — VITAMIN D 25 HYDROXY (VIT D DEFICIENCY, FRACTURES): Vit D, 25-Hydroxy: 30 ng/mL (ref 30–100)

## 2022-05-14 LAB — IRON,TIBC AND FERRITIN PANEL
%SAT: 9 % (calc) — ABNORMAL LOW (ref 16–45)
Ferritin: 5 ng/mL — ABNORMAL LOW (ref 16–154)
Iron: 28 ug/dL — ABNORMAL LOW (ref 40–190)
TIBC: 320 mcg/dL (calc) (ref 250–450)

## 2022-05-14 LAB — VITAMIN B12: Vitamin B-12: 418 pg/mL (ref 200–1100)

## 2022-05-14 LAB — TSH: TSH: 2.57 mIU/L

## 2022-05-16 NOTE — Addendum Note (Signed)
Addended bySamuel Bouche on: 05/16/2022 07:39 AM   Modules accepted: Orders

## 2022-05-17 ENCOUNTER — Other Ambulatory Visit: Payer: Self-pay | Admitting: Family

## 2022-05-17 DIAGNOSIS — D649 Anemia, unspecified: Secondary | ICD-10-CM

## 2022-05-18 ENCOUNTER — Inpatient Hospital Stay: Payer: Medicare HMO | Attending: Hematology & Oncology

## 2022-05-18 ENCOUNTER — Inpatient Hospital Stay (HOSPITAL_BASED_OUTPATIENT_CLINIC_OR_DEPARTMENT_OTHER): Payer: Medicare HMO | Admitting: Family

## 2022-05-18 ENCOUNTER — Other Ambulatory Visit: Payer: Self-pay

## 2022-05-18 ENCOUNTER — Encounter: Payer: Self-pay | Admitting: Family

## 2022-05-18 VITALS — BP 101/75 | HR 82 | Temp 97.7°F | Resp 18 | Ht 64.0 in | Wt 294.0 lb

## 2022-05-18 DIAGNOSIS — Z9884 Bariatric surgery status: Secondary | ICD-10-CM | POA: Diagnosis not present

## 2022-05-18 DIAGNOSIS — Z79899 Other long term (current) drug therapy: Secondary | ICD-10-CM | POA: Diagnosis not present

## 2022-05-18 DIAGNOSIS — D509 Iron deficiency anemia, unspecified: Secondary | ICD-10-CM | POA: Diagnosis not present

## 2022-05-18 DIAGNOSIS — D508 Other iron deficiency anemias: Secondary | ICD-10-CM

## 2022-05-18 DIAGNOSIS — K909 Intestinal malabsorption, unspecified: Secondary | ICD-10-CM | POA: Insufficient documentation

## 2022-05-18 DIAGNOSIS — D649 Anemia, unspecified: Secondary | ICD-10-CM

## 2022-05-18 LAB — RETICULOCYTES
Immature Retic Fract: 20.8 % — ABNORMAL HIGH (ref 2.3–15.9)
RBC.: 4.4 MIL/uL (ref 3.87–5.11)
Retic Count, Absolute: 71.3 10*3/uL (ref 19.0–186.0)
Retic Ct Pct: 1.6 % (ref 0.4–3.1)

## 2022-05-18 LAB — CBC WITH DIFFERENTIAL (CANCER CENTER ONLY)
Abs Immature Granulocytes: 0.05 10*3/uL (ref 0.00–0.07)
Basophils Absolute: 0 10*3/uL (ref 0.0–0.1)
Basophils Relative: 1 %
Eosinophils Absolute: 0.1 10*3/uL (ref 0.0–0.5)
Eosinophils Relative: 1 %
HCT: 36.8 % (ref 36.0–46.0)
Hemoglobin: 11.3 g/dL — ABNORMAL LOW (ref 12.0–15.0)
Immature Granulocytes: 1 %
Lymphocytes Relative: 20 %
Lymphs Abs: 1.2 10*3/uL (ref 0.7–4.0)
MCH: 25.6 pg — ABNORMAL LOW (ref 26.0–34.0)
MCHC: 30.7 g/dL (ref 30.0–36.0)
MCV: 83.3 fL (ref 80.0–100.0)
Monocytes Absolute: 0.5 10*3/uL (ref 0.1–1.0)
Monocytes Relative: 9 %
Neutro Abs: 4.2 10*3/uL (ref 1.7–7.7)
Neutrophils Relative %: 68 %
Platelet Count: 466 10*3/uL — ABNORMAL HIGH (ref 150–400)
RBC: 4.42 MIL/uL (ref 3.87–5.11)
RDW: 17.9 % — ABNORMAL HIGH (ref 11.5–15.5)
WBC Count: 6 10*3/uL (ref 4.0–10.5)
nRBC: 0 % (ref 0.0–0.2)

## 2022-05-18 LAB — LACTATE DEHYDROGENASE: LDH: 129 U/L (ref 98–192)

## 2022-05-18 LAB — CMP (CANCER CENTER ONLY)
ALT: 8 U/L (ref 0–44)
AST: 13 U/L — ABNORMAL LOW (ref 15–41)
Albumin: 4.2 g/dL (ref 3.5–5.0)
Alkaline Phosphatase: 93 U/L (ref 38–126)
Anion gap: 8 (ref 5–15)
BUN: 10 mg/dL (ref 6–20)
CO2: 27 mmol/L (ref 22–32)
Calcium: 9.4 mg/dL (ref 8.9–10.3)
Chloride: 106 mmol/L (ref 98–111)
Creatinine: 0.65 mg/dL (ref 0.44–1.00)
GFR, Estimated: >60 mL/min (ref >60)
Glucose, Bld: 83 mg/dL (ref 70–99)
Potassium: 4 mmol/L (ref 3.5–5.1)
Sodium: 141 mmol/L (ref 135–145)
Total Bilirubin: 0.6 mg/dL (ref 0.3–1.2)
Total Protein: 7.4 g/dL (ref 6.5–8.1)

## 2022-05-18 LAB — IRON AND IRON BINDING CAPACITY (CC-WL,HP ONLY)
Iron: 25 ug/dL — ABNORMAL LOW (ref 28–170)
Saturation Ratios: 8 % — ABNORMAL LOW (ref 10.4–31.8)
TIBC: 332 ug/dL (ref 250–450)
UIBC: 307 ug/dL (ref 148–442)

## 2022-05-18 LAB — FERRITIN: Ferritin: 7 ng/mL — ABNORMAL LOW (ref 11–307)

## 2022-05-18 NOTE — Progress Notes (Signed)
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 is a pleasant 37 yo female with iron deficiency anemia s/p gastric bypass in 2016 as well as heavy cycles.  She states that she has tried taking oral iron but this makes her nauseated and vomit.  Her cycle is regular with heavy flow. She has not noted any other blood loss. No bruising or petechiae.  She is symptomatic with fatigue, weakness and occasional dizziness.  No known familial history of anemia.  She is on Nexium daily for GERD.  Hgb is 11.3, MCV 83, platelets 466. Iron saturation last week was only 9% and ferritin 5.  No personal history of cancer. Her aunt had colon cancer.  No history of diabetes or thyroid disease.  She has history of ICH in 2018 with mild residual weakness on left side.  No fever, chills, n/v, cough, rash, SOB, chest pain, palpitations, abdominal pain or changes in bowel or bladder habits.  She has chronic constipation and takes Miralax.  No swelling, tenderness, numbness or tingling in her extremities.  No falls or syncope.  No smoking, ETOH or recreational drug use.  Appetite and hydration are good. Weight is 294 lbs.  She is currently on disability.   ROS: All other 10 point review of systems is negative.   PAST MEDICAL HISTORY:   Past Medical History:  Diagnosis Date   Anxiety    Depression    History of hiatal hernia    History of kidney stones    x5 episodes   Stroke ([place])     ALLERGIES: No Known Allergies    MEDICATIONS:  Current Outpatient Medications on File Prior to Visit  Medication Sig Dispense Refill   buPROPion (WELLBUTRIN XL) 150 MG 24 hr tablet Take 1 tablet (150 mg  total) by mouth every morning. 90 tablet 3   busPIRone (BUSPAR) 7.5 MG tablet Take 1-2 tablets (7.5-15 mg total) by mouth 2 (two) times daily. 90 tablet 3   FLUoxetine (PROZAC) 20 MG capsule Take 1 capsule (20 mg total) by mouth daily. 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 90 capsule 3   FLUoxetine (PROZAC) 40 MG capsule Take 1 capsule (40 mg total) by mouth daily. 90 capsule 3   No current facility-administered medications on file prior to visit.     PAST SURGICAL HISTORY Past Surgical History:  Procedure Laterality Date   LAPAROSCOPIC GASTRIC SLEEVE RESECTION WITH HIATAL HERNIA REPAIR N/A 08/25/2014   Procedure: LAPAROSCOPIC GASTRIC SLEEVE RESECTION WITH HIATAL HERNIA REPAIR;  Surgeon: Greer Pickerel, MD;  [facility];  Service: General;  Laterality: N/A;   UPPER GI ENDOSCOPY N/A 08/25/2014   Procedure: UPPER GI ENDOSCOPY;  Surgeon: Greer Pickerel, MD;  Location: [facility];  Service: General;  Laterality: N/A;   WISDOM TOOTH EXTRACTION      FAMILY HISTORY: Family History  Problem Relation Age of Onset   Depression Mother    Diabetes Mother    Depression Brother    Hyperlipidemia Brother    Diabetes Maternal Aunt    Depression Maternal Aunt    Cancer Maternal Aunt        uterine  Alcohol abuse Maternal Uncle     SOCIAL HISTORY:  reports that she has never smoked. She has never used smokeless tobacco. She reports that she does not currently use alcohol. She reports that she does not use drugs.  PERFORMANCE STATUS: The patient's performance status is 1 - Symptomatic but completely ambulatory  PHYSICAL EXAM: Most Recent Vital Signs: There were no vitals taken for this visit. BP 101/75 (BP Location: Right Arm, Patient Position: Sitting)   Pulse 82   Temp 97.7 F (36.5 C) (Oral)   Resp 18   Ht '5\' 4"'$  (1.626 m)   Wt 294 lb (133.4 kg)   BMI 50.46 kg/m   General Appearance:    Alert, cooperative, no distress, appears stated age  Head:    Normocephalic, without obvious  abnormality, atraumatic  Eyes:    PERRL, conjunctiva/corneas clear, EOM's intact, fundi    benign, both eyes        Throat:   Lips, mucosa, and tongue normal; teeth and gums normal  Neck:   Supple, symmetrical, trachea midline, no adenopathy;    thyroid:  no enlargement/tenderness/nodules; no carotid   bruit or JVD  Back:     Symmetric, no curvature, ROM normal, no CVA tenderness  Lungs:     Clear to auscultation bilaterally, respirations unlabored  Chest Wall:    No tenderness or deformity   Heart:    Regular rate and rhythm, S1 and S2 normal, no murmur, rub   or gallop     Abdomen:     Soft, non-tender, bowel sounds active all four quadrants,    no masses, no organomegaly        Extremities:   Extremities normal, atraumatic, no cyanosis or edema  Pulses:   2+ and symmetric all extremities  Skin:   Skin color, texture, turgor normal, no rashes or lesions  Lymph nodes:   Cervical, supraclavicular, and axillary nodes normal  Neurologic:   CNII-XII intact, normal strength, sensation and reflexes    throughout    LABORATORY DATA:  No results found for this or any previous visit (from the past 48 hour(s)).    RADIOGRAPHY: No results found.     PATHOLOGY: None  ASSESSMENT/PLAN: Ms. Lemos is a pleasant 37 yo female with iron deficiency anemia s/p gastric bypass in 2016 as well as heavy cycles.  We will get her set up for 3 doses of IV iron.  Follow-up in 2 months.   All questions were answered. The patient knows to call the clinic with any problems, questions or concerns. We can certainly see the patient much sooner if necessary.   Lottie Dawson, NP

## 2022-05-18 NOTE — Progress Notes (Signed)
Pt did not want to answer

## 2022-05-19 LAB — ERYTHROPOIETIN: Erythropoietin: 71.9 m[IU]/mL — ABNORMAL HIGH (ref 2.6–18.5)

## 2022-05-27 ENCOUNTER — Inpatient Hospital Stay: Payer: Medicare HMO

## 2022-05-27 VITALS — BP 108/73 | HR 70 | Temp 97.9°F | Resp 17

## 2022-05-27 DIAGNOSIS — K909 Intestinal malabsorption, unspecified: Secondary | ICD-10-CM

## 2022-05-27 DIAGNOSIS — Z9884 Bariatric surgery status: Secondary | ICD-10-CM

## 2022-05-27 DIAGNOSIS — D509 Iron deficiency anemia, unspecified: Secondary | ICD-10-CM | POA: Diagnosis not present

## 2022-05-27 DIAGNOSIS — D508 Other iron deficiency anemias: Secondary | ICD-10-CM

## 2022-05-27 MED ORDER — SODIUM CHLORIDE 0.9 % IV SOLN
300.0000 mg | Freq: Once | INTRAVENOUS | Status: AC
  Administered 2022-05-27: 300 mg via INTRAVENOUS
  Filled 2022-05-27: qty 300

## 2022-05-27 MED ORDER — SODIUM CHLORIDE 0.9 % IV SOLN: Freq: Once | INTRAVENOUS | Status: AC

## 2022-05-27 NOTE — Patient Instructions (Signed)

## 2022-05-30 ENCOUNTER — Telehealth: Payer: Self-pay | Admitting: *Deleted

## 2022-05-30 NOTE — Telephone Encounter (Signed)
Called and lvm of changed time for infusion for 3/29 - requested callback to confirm.

## 2022-06-03 ENCOUNTER — Inpatient Hospital Stay: Payer: Medicare HMO

## 2022-06-03 VITALS — BP 111/67 | HR 67 | Temp 97.7°F | Resp 17

## 2022-06-03 DIAGNOSIS — Z9884 Bariatric surgery status: Secondary | ICD-10-CM

## 2022-06-03 DIAGNOSIS — D509 Iron deficiency anemia, unspecified: Secondary | ICD-10-CM | POA: Diagnosis not present

## 2022-06-03 DIAGNOSIS — D508 Other iron deficiency anemias: Secondary | ICD-10-CM

## 2022-06-03 DIAGNOSIS — K909 Intestinal malabsorption, unspecified: Secondary | ICD-10-CM

## 2022-06-03 MED ORDER — SODIUM CHLORIDE 0.9% FLUSH: 10.0000 mL | Freq: Once | INTRAVENOUS | Status: DC | PRN

## 2022-06-03 MED ORDER — SODIUM CHLORIDE 0.9% FLUSH: 3.0000 mL | Freq: Once | INTRAVENOUS | Status: DC | PRN

## 2022-06-03 MED ORDER — SODIUM CHLORIDE 0.9 % IV SOLN
300.0000 mg | Freq: Once | INTRAVENOUS | Status: AC
  Administered 2022-06-03: 300 mg via INTRAVENOUS
  Filled 2022-06-03: qty 300

## 2022-06-03 MED ORDER — SODIUM CHLORIDE 0.9 % IV SOLN: Freq: Once | INTRAVENOUS | Status: AC

## 2022-06-03 NOTE — Patient Instructions (Signed)

## 2022-06-07 ENCOUNTER — Encounter: Payer: Self-pay | Admitting: Medical-Surgical

## 2022-06-07 ENCOUNTER — Ambulatory Visit (INDEPENDENT_AMBULATORY_CARE_PROVIDER_SITE_OTHER): Payer: Medicare HMO | Admitting: Medical-Surgical

## 2022-06-07 ENCOUNTER — Other Ambulatory Visit (HOSPITAL_COMMUNITY)
Admission: RE | Admit: 2022-06-07 | Discharge: 2022-06-07 | Disposition: A | Discharge status: Home / Self Care | Payer: Medicare HMO | Source: Ambulatory Visit | Attending: Medical-Surgical | Admitting: Medical-Surgical

## 2022-06-07 VITALS — BP 108/72 | HR 92 | Resp 20 | Ht 64.0 in | Wt 296.2 lb

## 2022-06-07 DIAGNOSIS — Z01419 Encounter for gynecological examination (general) (routine) without abnormal findings: Secondary | ICD-10-CM | POA: Insufficient documentation

## 2022-06-07 DIAGNOSIS — Z1151 Encounter for screening for human papillomavirus (HPV): Secondary | ICD-10-CM | POA: Diagnosis not present

## 2022-06-07 DIAGNOSIS — R300 Dysuria: Secondary | ICD-10-CM

## 2022-06-07 DIAGNOSIS — Z124 Encounter for screening for malignant neoplasm of cervix: Secondary | ICD-10-CM | POA: Insufficient documentation

## 2022-06-07 LAB — POCT URINALYSIS DIP (CLINITEK)
Glucose, UA: NEGATIVE mg/dL
Ketones, POC UA: NEGATIVE mg/dL
Nitrite, UA: NEGATIVE
POC PROTEIN,UA: 30 — AB
Spec Grav, UA: >=1.03 — AB (ref 1.010–1.025)
Urobilinogen, UA: 0.2 E.U./dL
pH, UA: 6 (ref 5.0–8.0)

## 2022-06-07 LAB — WET PREP FOR TRICH, YEAST, CLUE
MICRO NUMBER:: 14771404
Specimen Quality: ADEQUATE

## 2022-06-07 NOTE — Progress Notes (Signed)
        Established patient visit  History, exam, impression, and plan:  1. Cervical cancer screening Due for colon cancer screening.  Reports her last one was at least 5 years ago with normal results.  No history of abnormal Pap smears.  Denies need for STI testing as well as current vaginal concerns.  No abnormal discharge or odor noted.  Completing Pap smear with HPV cotesting today.  If normal, plan to repeat in 5 years. - Cytology - PAP  2. Dysuria Has had approximately 1 week of dysuria.  Notes that she has the urge but she feels like she needs to go to the bathroom however she is unable to urinate when she gets there.  Her urine has been darker than usual.  Denies blood, frequency, back pain, nausea, vomiting, fever, and chills.  No abnormal urinary odor or hematuria.  Has not tried any treatment at home.  Discussed the potential causes of her symptoms.  Getting a wet prep to evaluate for BV/yeast, resulted negative.  POCT urinalysis completed today, positive for leuks, blood, protein, bilirubin, and elevated spec gravity. Sending for culture.  Admits that she is not drinking enough fluids so dehydration may be playing a role.  Recommend increasing fluid intake to at least 64 ounces per day. - POCT URINALYSIS DIP (CLINITEK) - Urine Culture - WET PREP FOR TRICH, YEAST, CLUE  Procedures performed this visit: None.  Return if symptoms worsen or fail to improve.  __________________________________ Clearnce Sorrel, DNP, APRN, FNP-BC Primary Care and [place]

## 2022-06-08 LAB — CYTOLOGY - PAP
Adequacy: ABSENT
Comment: NEGATIVE
Diagnosis: NEGATIVE
High risk HPV: NEGATIVE

## 2022-06-09 MED ORDER — NITROFURANTOIN MONOHYD MACRO 100 MG PO CAPS: 100.0000 mg | ORAL_CAPSULE | Freq: Two times a day (BID) | ORAL | 0 refills | Status: DC

## 2022-06-09 NOTE — Addendum Note (Signed)
Addended bySamuel Bouche on: 06/09/2022 01:15 PM   Modules accepted: Orders

## 2022-06-10 ENCOUNTER — Inpatient Hospital Stay: Payer: Medicare HMO | Attending: Hematology & Oncology

## 2022-06-10 VITALS — BP 119/67 | HR 72 | Temp 98.5°F | Resp 18

## 2022-06-10 DIAGNOSIS — D509 Iron deficiency anemia, unspecified: Secondary | ICD-10-CM | POA: Diagnosis present

## 2022-06-10 DIAGNOSIS — K909 Intestinal malabsorption, unspecified: Secondary | ICD-10-CM

## 2022-06-10 DIAGNOSIS — D508 Other iron deficiency anemias: Secondary | ICD-10-CM

## 2022-06-10 DIAGNOSIS — Z9884 Bariatric surgery status: Secondary | ICD-10-CM

## 2022-06-10 LAB — URINE CULTURE
MICRO NUMBER:: 14771948
SPECIMEN QUALITY:: ADEQUATE

## 2022-06-10 MED ORDER — SODIUM CHLORIDE 0.9 % IV SOLN
300.0000 mg | Freq: Once | INTRAVENOUS | Status: AC
  Administered 2022-06-10: 300 mg via INTRAVENOUS
  Filled 2022-06-10: qty 300

## 2022-06-10 MED ORDER — SODIUM CHLORIDE 0.9 % IV SOLN: Freq: Once | INTRAVENOUS | Status: AC

## 2022-07-18 ENCOUNTER — Inpatient Hospital Stay: Payer: Medicare HMO | Admitting: Family

## 2022-07-18 ENCOUNTER — Inpatient Hospital Stay: Payer: Medicare HMO

## 2022-11-14 ENCOUNTER — Encounter: Payer: Self-pay | Admitting: Medical-Surgical

## 2022-11-14 ENCOUNTER — Ambulatory Visit (INDEPENDENT_AMBULATORY_CARE_PROVIDER_SITE_OTHER): Payer: Medicare HMO | Admitting: Medical-Surgical

## 2022-11-14 VITALS — BP 117/83 | HR 84 | Resp 20 | Ht 64.0 in | Wt 314.1 lb

## 2022-11-14 DIAGNOSIS — I10 Essential (primary) hypertension: Secondary | ICD-10-CM

## 2022-11-14 DIAGNOSIS — F330 Major depressive disorder, recurrent, mild: Secondary | ICD-10-CM

## 2022-11-14 DIAGNOSIS — K909 Intestinal malabsorption, unspecified: Secondary | ICD-10-CM

## 2022-11-14 DIAGNOSIS — E785 Hyperlipidemia, unspecified: Secondary | ICD-10-CM

## 2022-11-14 DIAGNOSIS — E519 Thiamine deficiency, unspecified: Secondary | ICD-10-CM

## 2022-11-14 DIAGNOSIS — F411 Generalized anxiety disorder: Secondary | ICD-10-CM

## 2022-11-14 MED ORDER — FLUOXETINE HCL 20 MG PO CAPS: 20.0000 mg | ORAL_CAPSULE | Freq: Every day | ORAL | 3 refills | Status: DC

## 2022-11-14 MED ORDER — BUSPIRONE HCL 15 MG PO TABS: 15.0000 mg | ORAL_TABLET | Freq: Two times a day (BID) | ORAL | 3 refills | Status: DC

## 2022-11-14 MED ORDER — FLUOXETINE HCL 40 MG PO CAPS: 40.0000 mg | ORAL_CAPSULE | Freq: Every day | ORAL | 3 refills | Status: DC

## 2022-11-14 MED ORDER — BUPROPION HCL ER (XL) 150 MG PO TB24: 150.0000 mg | ORAL_TABLET | ORAL | 3 refills | Status: DC

## 2022-11-14 NOTE — Progress Notes (Signed)
        Established patient visit  History, exam, impression, and plan:  1. Essential (primary) hypertension Pleasant 37 year old female presenting today with a history of essential hypertension.  She is not currently on any medications and does not currently check her blood pressure at home.  Following a low-sodium diet for the most part.  No regular exercise.  Plan to check labs as below.  Blood pressure at goal today. - CBC with Differential/Platelet - CMP14+EGFR - Lipid panel  2. 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  She was following with hematology but has not been back there for approximately 5 to 6 months.  Last iron levels checked in March of this year.  Notes that she is taking a beef liver supplement which seems to help with energy levels and feels that it is working for her.  Rechecking labs today. - CBC with Differential/Platelet - Iron, TIBC and Ferritin Panel - CMP14+EGFR - Folate  3. Thiamine deficiency History of thiamine deficiency.  Checking vitamin B1 today. - Vitamin B1  4. Morbid obesity (HCC) BMI at 53.92 today.  Aware that weight loss is recommended.  Not currently doing any regular intentional exercise but notes that she would like to get back to doing this.  Has been trying to do keto for her diet recently but has not noticed much difference.  Discussed recommendations for cardiovascular and strength training exercises for the best results.  Reviewed recommendations for high-protein, moderate carbs, and low-fat diet to increase sustainability.  5. GAD (generalized anxiety disorder) 6. Mild episode of recurrent major depressive disorder (HCC) Currently taking fluoxetine 60 mg daily and BuSpar 15 mg daily.  Also on Wellbutrin 150 mg daily.  Feels the medications are working well for her and she has no concerning side effects.  Mood, affect, thought pattern, cognition, and speech all normal today.  Denies SI/HI.  Continue  fluoxetine, Wellbutrin, and BuSpar as prescribed.   Procedures performed this visit: None.  Return in about 6 months (around 05/14/2023) for mood follow up.  __________________________________ Thayer Ohm, DNP, APRN, FNP-BC Primary Care and Sports Medicine [facility] [place]

## 2022-11-15 ENCOUNTER — Other Ambulatory Visit: Payer: Self-pay | Admitting: Medical-Surgical

## 2022-11-17 LAB — IRON,TIBC AND FERRITIN PANEL
Ferritin: 16 ng/mL (ref 15–150)
Iron Saturation: 21 % (ref 15–55)
Iron: 63 ug/dL (ref 27–159)
Total Iron Binding Capacity: 301 ug/dL (ref 250–450)
UIBC: 238 ug/dL (ref 131–425)

## 2022-11-17 LAB — CMP14+EGFR
ALT: 9 IU/L (ref 0–32)
AST: 13 IU/L (ref 0–40)
Albumin: 4 g/dL (ref 3.9–4.9)
Alkaline Phosphatase: 120 IU/L (ref 44–121)
BUN/Creatinine Ratio: 15 (ref 9–23)
BUN: 9 mg/dL (ref 6–20)
Bilirubin Total: 0.5 mg/dL (ref 0.0–1.2)
CO2: 20 mmol/L (ref 20–29)
Calcium: 9.4 mg/dL (ref 8.7–10.2)
Chloride: 107 mmol/L — ABNORMAL HIGH (ref 96–106)
Creatinine, Ser: 0.61 mg/dL (ref 0.57–1.00)
Globulin, Total: 2.9 g/dL (ref 1.5–4.5)
Glucose: 77 mg/dL (ref 70–99)
Potassium: 4 mmol/L (ref 3.5–5.2)
Sodium: 143 mmol/L (ref 134–144)
Total Protein: 6.9 g/dL (ref 6.0–8.5)
eGFR: 118 mL/min/{1.73_m2} (ref >59)

## 2022-11-17 LAB — CBC WITH DIFFERENTIAL/PLATELET
Basophils Absolute: 0 10*3/uL (ref 0.0–0.2)
Basos: 1 %
EOS (ABSOLUTE): 0.1 10*3/uL (ref 0.0–0.4)
Eos: 1 %
Hematocrit: 45.3 % (ref 34.0–46.6)
Hemoglobin: 14.6 g/dL (ref 11.1–15.9)
Immature Grans (Abs): 0 10*3/uL (ref 0.0–0.1)
Immature Granulocytes: 0 %
Lymphocytes Absolute: 1.4 10*3/uL (ref 0.7–3.1)
Lymphs: 25 %
MCH: 31 pg (ref 26.6–33.0)
MCHC: 32.2 g/dL (ref 31.5–35.7)
MCV: 96 fL (ref 79–97)
Monocytes Absolute: 0.5 10*3/uL (ref 0.1–0.9)
Monocytes: 9 %
Neutrophils Absolute: 3.7 10*3/uL (ref 1.4–7.0)
Neutrophils: 64 %
Platelets: 397 10*3/uL (ref 150–450)
RBC: 4.71 x10E6/uL (ref 3.77–5.28)
RDW: 11.4 % — ABNORMAL LOW (ref 11.7–15.4)
WBC: 5.7 10*3/uL (ref 3.4–10.8)

## 2022-11-17 LAB — LIPID PANEL
Chol/HDL Ratio: 3.6 ratio (ref 0.0–4.4)
Cholesterol, Total: 218 mg/dL — ABNORMAL HIGH (ref 100–199)
HDL: 60 mg/dL (ref >39)
LDL Chol Calc (NIH): 144 mg/dL — ABNORMAL HIGH (ref 0–99)
Triglycerides: 82 mg/dL (ref 0–149)
VLDL Cholesterol Cal: 14 mg/dL (ref 5–40)

## 2022-11-17 LAB — FOLATE: Folate: 2.7 ng/mL — ABNORMAL LOW (ref >3.0)

## 2022-11-17 LAB — VITAMIN B1: Thiamine: 100.7 nmol/L (ref 66.5–200.0)

## 2022-11-23 MED ORDER — ATORVASTATIN CALCIUM 20 MG PO TABS
20.0000 mg | ORAL_TABLET | Freq: Every day | ORAL | 3 refills | Status: DC
Start: 2022-11-23 — End: 2023-11-15

## 2022-11-23 NOTE — Addendum Note (Signed)
Addended byChristen Butter on: 11/23/2022 06:53 PM   Modules accepted: Orders

## 2023-04-15 ENCOUNTER — Encounter: Payer: Self-pay | Admitting: Family

## 2023-05-15 ENCOUNTER — Ambulatory Visit (INDEPENDENT_AMBULATORY_CARE_PROVIDER_SITE_OTHER): Payer: Medicare HMO | Admitting: Medical-Surgical

## 2023-05-15 ENCOUNTER — Encounter: Payer: Self-pay | Admitting: Medical-Surgical

## 2023-05-15 VITALS — BP 103/70 | HR 87 | Resp 20 | Ht 64.0 in | Wt 282.1 lb

## 2023-05-15 DIAGNOSIS — F4310 Post-traumatic stress disorder, unspecified: Secondary | ICD-10-CM

## 2023-05-15 DIAGNOSIS — K909 Intestinal malabsorption, unspecified: Secondary | ICD-10-CM

## 2023-05-15 DIAGNOSIS — F411 Generalized anxiety disorder: Secondary | ICD-10-CM | POA: Diagnosis not present

## 2023-05-15 DIAGNOSIS — E785 Hyperlipidemia, unspecified: Secondary | ICD-10-CM | POA: Diagnosis not present

## 2023-05-15 DIAGNOSIS — E559 Vitamin D deficiency, unspecified: Secondary | ICD-10-CM

## 2023-05-15 DIAGNOSIS — E538 Deficiency of other specified B group vitamins: Secondary | ICD-10-CM

## 2023-05-15 DIAGNOSIS — F39 Unspecified mood [affective] disorder: Secondary | ICD-10-CM

## 2023-05-15 DIAGNOSIS — F330 Major depressive disorder, recurrent, mild: Secondary | ICD-10-CM | POA: Diagnosis not present

## 2023-05-15 NOTE — Progress Notes (Signed)
        Established patient visit  History, exam, impression, and plan:  1. Hyperlipidemia, unspecified hyperlipidemia type (Primary) Pleasant 38 year old female presenting today with a history of stroke and hyperlipidemia.  She was prescribed atorvastatin 20 mg daily but reports that she has not been taking this in approximately 6 months because she lost her pill bottle and then forgot about getting it refilled.  Has been working on dietary modification and has lost 32 pounds on her own since her last visit.  Walking for exercise but this is limited due to stroke residuals.  Strongly encouraged getting back on atorvastatin due to her high risk of repeat stroke.  Plan to check CMP and lipids in 6 weeks after restarting the medication. - CMP14+EGFR - Lipid panel  2. Mood disorder (HCC) 3. PTSD (post-traumatic stress disorder) 4. Mild episode of recurrent major depressive disorder (HCC) 5. GAD (generalized anxiety disorder) Currently taking fluoxetine 60 mg daily, BuSpar 15 mg daily in the morning, and Wellbutrin 150 mg daily.  Tolerating all 3 medications well without side effects.  Feels that these medications are working well to keep her mood stabilized and does not have any breakthrough panic attacks.  Expresses the ability to still feel emotions as she will often cry at sad movies.  Denies SI/HI.  Mood, affect, thoughts, cognition, and speech all normal during the appointment.  Continue fluoxetine, BuSpar, and Wellbutrin as prescribed.  6. Vitamin D deficiency Checking vitamin D. - VITAMIN D 25 Hydroxy (Vit-D Deficiency, Fractures)  7. Folate deficiency Checking folate. - Folate  8. Iron malabsorption Checking iron panel. - Iron, TIBC and Ferritin Panel  9. Morbid obesity (HCC) Has done very well since her last visit with the loss of 32 pounds on her own.  Cut out sodas and sweets and has been walking as much as possible for exercise.  Happy with her progress so far and says her first  goal is 215 pounds which is what she weighed right before her stroke.  After that she has a goal to get below 200 pounds.  Discussed importance of portion control, healthy dietary intake, and increased exercise.  Encouraged her to start doing small short bursts of strength training to her tolerance to help build muscle while she is working on losing weight.   Procedures performed this visit: None.  Return in about 6 months (around 11/15/2023) for mood follow up.  __________________________________ Thayer Ohm, DNP, APRN, FNP-BC Primary Care and Sports Medicine [facility] [place]

## 2023-06-26 ENCOUNTER — Encounter: Payer: Self-pay | Admitting: Medical-Surgical

## 2023-07-19 ENCOUNTER — Ambulatory Visit: Payer: Self-pay | Admitting: Medical-Surgical

## 2023-07-19 LAB — LIPID PANEL
Chol/HDL Ratio: 4.2 ratio (ref 0.0–4.4)
Cholesterol, Total: 165 mg/dL (ref 100–199)
HDL: 39 mg/dL — ABNORMAL LOW (ref >39)
LDL Chol Calc (NIH): 107 mg/dL — ABNORMAL HIGH (ref 0–99)
Triglycerides: 100 mg/dL (ref 0–149)
VLDL Cholesterol Cal: 19 mg/dL (ref 5–40)

## 2023-07-19 LAB — CMP14+EGFR
ALT: 8 IU/L (ref 0–32)
AST: 18 IU/L (ref 0–40)
Albumin: 3.7 g/dL — ABNORMAL LOW (ref 3.9–4.9)
Alkaline Phosphatase: 107 IU/L (ref 44–121)
BUN/Creatinine Ratio: 16 (ref 9–23)
BUN: 9 mg/dL (ref 6–20)
Bilirubin Total: 0.6 mg/dL (ref 0.0–1.2)
CO2: 17 mmol/L — ABNORMAL LOW (ref 20–29)
Calcium: 9.6 mg/dL (ref 8.7–10.2)
Chloride: 106 mmol/L (ref 96–106)
Creatinine, Ser: 0.58 mg/dL (ref 0.57–1.00)
Globulin, Total: 2.8 g/dL (ref 1.5–4.5)
Glucose: 81 mg/dL (ref 70–99)
Potassium: 4.6 mmol/L (ref 3.5–5.2)
Sodium: 141 mmol/L (ref 134–144)
Total Protein: 6.5 g/dL (ref 6.0–8.5)
eGFR: 119 mL/min/{1.73_m2} (ref >59)

## 2023-07-19 LAB — IRON,TIBC AND FERRITIN PANEL
Ferritin: 51 ng/mL (ref 15–150)
Iron Saturation: 55 % (ref 15–55)
Iron: 122 ug/dL (ref 27–159)
Total Iron Binding Capacity: 221 ug/dL — ABNORMAL LOW (ref 250–450)
UIBC: 99 ug/dL — ABNORMAL LOW (ref 131–425)

## 2023-07-19 LAB — VITAMIN D 25 HYDROXY (VIT D DEFICIENCY, FRACTURES): Vit D, 25-Hydroxy: 26.2 ng/mL — ABNORMAL LOW (ref 30.0–100.0)

## 2023-07-19 LAB — FOLATE: Folate: 3.7 ng/mL (ref >3.0)

## 2023-11-15 ENCOUNTER — Ambulatory Visit (INDEPENDENT_AMBULATORY_CARE_PROVIDER_SITE_OTHER): Admitting: Medical-Surgical

## 2023-11-15 ENCOUNTER — Encounter: Payer: Self-pay | Admitting: Medical-Surgical

## 2023-11-15 VITALS — BP 104/70 | HR 78 | Resp 20 | Ht 64.0 in | Wt 270.0 lb

## 2023-11-15 DIAGNOSIS — Z23 Encounter for immunization: Secondary | ICD-10-CM | POA: Diagnosis not present

## 2023-11-15 DIAGNOSIS — K219 Gastro-esophageal reflux disease without esophagitis: Secondary | ICD-10-CM | POA: Diagnosis not present

## 2023-11-15 DIAGNOSIS — E785 Hyperlipidemia, unspecified: Secondary | ICD-10-CM

## 2023-11-15 MED ORDER — BUSPIRONE HCL 15 MG PO TABS: 15.0000 mg | ORAL_TABLET | Freq: Two times a day (BID) | ORAL | 3 refills | Status: AC

## 2023-11-15 MED ORDER — ATORVASTATIN CALCIUM 20 MG PO TABS
20.0000 mg | ORAL_TABLET | Freq: Every day | ORAL | 3 refills | Status: AC
Start: 2023-11-15 — End: ?

## 2023-11-15 MED ORDER — PANTOPRAZOLE SODIUM 20 MG PO TBEC: 20.0000 mg | DELAYED_RELEASE_TABLET | Freq: Every day | ORAL | 3 refills | Status: AC

## 2023-11-15 MED ORDER — FLUOXETINE HCL 20 MG PO CAPS: 20.0000 mg | ORAL_CAPSULE | Freq: Every day | ORAL | 3 refills | Status: AC

## 2023-11-15 MED ORDER — BUPROPION HCL ER (XL) 150 MG PO TB24: 150.0000 mg | ORAL_TABLET | ORAL | 3 refills | Status: AC

## 2023-11-15 MED ORDER — FLUOXETINE HCL 40 MG PO CAPS: 40.0000 mg | ORAL_CAPSULE | Freq: Every day | ORAL | 3 refills | Status: AC

## 2023-11-15 NOTE — Progress Notes (Signed)
        Established patient visit   History of Present Illness   Discussed the use of AI scribe software for clinical note transcription with the patient, who gave verbal consent to proceed.  History of Present Illness   Dorothy Marquez is a 38 year old female who presents for medication management and follow-up.  Gastrointestinal symptoms - Significant acid reflux resulting in dental damage - Nexium 20 mg daily provides symptom control - Burning sensations occur when a dose is missed - History of laparoscopic sleeve gastrectomy - Iron  malabsorption - Folate deficiency - Thiamine  deficiency  Psychiatric symptoms - Anxiety/depression managed with fluoxetine  60 mg daily, Buspar  twice a day, and Wellbutrin  150 mg - Post-traumatic stress disorder (PTSD)  Hyperlipidemia - High cholesterol managed with Lipitor  - Hx of CVA       Physical Exam   Physical Exam Vitals reviewed.  Constitutional:      General: She is not in acute distress.    Appearance: Normal appearance. She is not ill-appearing.  HENT:     Head: Normocephalic and atraumatic.  Cardiovascular:     Rate and Rhythm: Normal rate and regular rhythm.     Pulses: Normal pulses.     Heart sounds: Normal heart sounds. No murmur heard.    No friction rub. No gallop.  Pulmonary:     Effort: Pulmonary effort is normal. No respiratory distress.     Breath sounds: Normal breath sounds. No wheezing.  Skin:    General: Skin is warm and dry.  Neurological:     Mental Status: She is alert and oriented to person, place, and time.  Psychiatric:        Mood and Affect: Mood normal.        Behavior: Behavior normal.        Thought Content: Thought content normal.        Judgment: Judgment normal.    Assessment & Plan   Assessment and Plan    Gastroesophageal reflux disease Chronic GERD with severe symptoms causing dental erosion. Nexium effective but costly. - Switch to Protonix  20 mg daily for cost  management. - Educated on untreated reflux risks: esophageal bleeds, cancerous changes; and medication risks: bone loss, stomach infections.  Major depressive disorder and generalized anxiety disorder/PTSD Symptoms well-managed with current medication regimen. - Continue fluoxetine  60 mg, Buspar  twice daily, Wellbutrin  150 mg. - Refill all current medications.  Hyperlipidemia Currently managed with Lipitor. Labs UTD. - Continue Lipitor.     Follow up   Return in about 6 months (around 05/14/2024) for chronic disease follow up. __________________________________ Zada FREDRIK Palin, DNP, APRN, FNP-BC Primary Care and Sports Medicine [facility] [place]

## 2023-11-16 ENCOUNTER — Other Ambulatory Visit: Payer: Self-pay | Admitting: Medical-Surgical

## 2024-02-04 ENCOUNTER — Other Ambulatory Visit: Payer: Self-pay | Admitting: Medical-Surgical

## 2024-02-04 DIAGNOSIS — E785 Hyperlipidemia, unspecified: Secondary | ICD-10-CM

## 2024-05-15 ENCOUNTER — Ambulatory Visit: Admitting: Medical-Surgical
